# Patient Record
Sex: Female | Born: 1945 | Race: White | Hispanic: No | Marital: Married | Smoking: Former smoker
Health system: Southern US, Community
[De-identification: ages and names within clinical notes are randomized; demographics above are authoritative.]

## PROBLEM LIST (undated history)

## (undated) DIAGNOSIS — T8859XA Other complications of anesthesia, initial encounter: Secondary | ICD-10-CM

## (undated) DIAGNOSIS — H409 Unspecified glaucoma: Secondary | ICD-10-CM

## (undated) DIAGNOSIS — T4145XA Adverse effect of unspecified anesthetic, initial encounter: Secondary | ICD-10-CM

## (undated) DIAGNOSIS — M810 Age-related osteoporosis without current pathological fracture: Secondary | ICD-10-CM

## (undated) DIAGNOSIS — E039 Hypothyroidism, unspecified: Secondary | ICD-10-CM

## (undated) DIAGNOSIS — I10 Essential (primary) hypertension: Secondary | ICD-10-CM

## (undated) DIAGNOSIS — K5790 Diverticulosis of intestine, part unspecified, without perforation or abscess without bleeding: Secondary | ICD-10-CM

## (undated) DIAGNOSIS — E7800 Pure hypercholesterolemia, unspecified: Secondary | ICD-10-CM

## (undated) HISTORY — DX: Essential (primary) hypertension: I10

## (undated) HISTORY — PX: DILATION AND CURETTAGE OF UTERUS: SHX78

## (undated) HISTORY — DX: Hypothyroidism, unspecified: E03.9

## (undated) HISTORY — PX: KNEE SURGERY: SHX244

## (undated) HISTORY — PX: HAND SURGERY: SHX662

## (undated) HISTORY — DX: Pure hypercholesterolemia, unspecified: E78.00

## (undated) HISTORY — DX: Age-related osteoporosis without current pathological fracture: M81.0

---

## 1998-04-02 ENCOUNTER — Encounter: Payer: Self-pay | Admitting: Internal Medicine

## 1998-04-02 ENCOUNTER — Ambulatory Visit (HOSPITAL_COMMUNITY): Admission: RE | Admit: 1998-04-02 | Discharge: 1998-04-02 | Payer: Self-pay | Admitting: Internal Medicine

## 1998-04-17 ENCOUNTER — Encounter: Admission: RE | Admit: 1998-04-17 | Discharge: 1998-05-09 | Payer: Self-pay | Admitting: Internal Medicine

## 1999-01-23 ENCOUNTER — Other Ambulatory Visit: Admission: RE | Admit: 1999-01-23 | Discharge: 1999-01-23 | Payer: Self-pay | Admitting: Obstetrics and Gynecology

## 2000-01-24 ENCOUNTER — Other Ambulatory Visit: Admission: RE | Admit: 2000-01-24 | Discharge: 2000-01-24 | Payer: Self-pay | Admitting: Obstetrics and Gynecology

## 2001-04-23 ENCOUNTER — Ambulatory Visit (HOSPITAL_COMMUNITY): Admission: RE | Admit: 2001-04-23 | Discharge: 2001-04-23 | Payer: Self-pay | Admitting: Internal Medicine

## 2001-06-07 ENCOUNTER — Encounter: Payer: Self-pay | Admitting: Internal Medicine

## 2001-06-07 ENCOUNTER — Encounter: Admission: RE | Admit: 2001-06-07 | Discharge: 2001-06-07 | Payer: Self-pay | Admitting: Internal Medicine

## 2001-07-30 ENCOUNTER — Encounter: Payer: Self-pay | Admitting: Internal Medicine

## 2001-07-30 ENCOUNTER — Ambulatory Visit (HOSPITAL_COMMUNITY): Admission: RE | Admit: 2001-07-30 | Discharge: 2001-07-30 | Payer: Self-pay | Admitting: Internal Medicine

## 2002-01-27 ENCOUNTER — Other Ambulatory Visit: Admission: RE | Admit: 2002-01-27 | Discharge: 2002-01-27 | Payer: Self-pay | Admitting: Obstetrics and Gynecology

## 2002-06-15 ENCOUNTER — Encounter: Payer: Self-pay | Admitting: Internal Medicine

## 2002-06-15 ENCOUNTER — Encounter: Admission: RE | Admit: 2002-06-15 | Discharge: 2002-06-15 | Payer: Self-pay | Admitting: Internal Medicine

## 2002-07-04 ENCOUNTER — Encounter: Admission: RE | Admit: 2002-07-04 | Discharge: 2002-07-04 | Payer: Self-pay | Admitting: Internal Medicine

## 2002-07-04 ENCOUNTER — Encounter: Payer: Self-pay | Admitting: Internal Medicine

## 2003-01-30 ENCOUNTER — Other Ambulatory Visit: Admission: RE | Admit: 2003-01-30 | Discharge: 2003-01-30 | Payer: Self-pay | Admitting: Obstetrics and Gynecology

## 2003-06-07 ENCOUNTER — Ambulatory Visit (HOSPITAL_BASED_OUTPATIENT_CLINIC_OR_DEPARTMENT_OTHER): Admission: RE | Admit: 2003-06-07 | Discharge: 2003-06-07 | Payer: Self-pay | Admitting: Otolaryngology

## 2003-06-07 ENCOUNTER — Encounter (INDEPENDENT_AMBULATORY_CARE_PROVIDER_SITE_OTHER): Payer: Self-pay | Admitting: *Deleted

## 2003-06-07 ENCOUNTER — Ambulatory Visit (HOSPITAL_COMMUNITY): Admission: RE | Admit: 2003-06-07 | Discharge: 2003-06-07 | Payer: Self-pay | Admitting: Otolaryngology

## 2004-02-07 ENCOUNTER — Other Ambulatory Visit: Admission: RE | Admit: 2004-02-07 | Discharge: 2004-02-07 | Payer: Self-pay | Admitting: Obstetrics and Gynecology

## 2005-02-07 ENCOUNTER — Other Ambulatory Visit: Admission: RE | Admit: 2005-02-07 | Discharge: 2005-02-07 | Payer: Self-pay | Admitting: Obstetrics and Gynecology

## 2005-03-03 ENCOUNTER — Encounter: Admission: RE | Admit: 2005-03-03 | Discharge: 2005-03-03 | Payer: Self-pay | Admitting: Obstetrics and Gynecology

## 2006-02-12 ENCOUNTER — Other Ambulatory Visit: Admission: RE | Admit: 2006-02-12 | Discharge: 2006-02-12 | Payer: Self-pay | Admitting: Obstetrics and Gynecology

## 2006-03-04 ENCOUNTER — Encounter: Admission: RE | Admit: 2006-03-04 | Discharge: 2006-03-04 | Payer: Self-pay | Admitting: Internal Medicine

## 2006-05-28 ENCOUNTER — Ambulatory Visit (HOSPITAL_COMMUNITY): Admission: RE | Admit: 2006-05-28 | Discharge: 2006-05-28 | Payer: Self-pay | Admitting: Internal Medicine

## 2007-02-15 ENCOUNTER — Other Ambulatory Visit: Admission: RE | Admit: 2007-02-15 | Discharge: 2007-02-15 | Payer: Self-pay | Admitting: Obstetrics and Gynecology

## 2007-03-15 ENCOUNTER — Encounter: Admission: RE | Admit: 2007-03-15 | Discharge: 2007-03-15 | Payer: Self-pay | Admitting: Obstetrics and Gynecology

## 2008-02-29 ENCOUNTER — Ambulatory Visit: Payer: Self-pay | Admitting: Obstetrics and Gynecology

## 2008-02-29 ENCOUNTER — Other Ambulatory Visit: Admission: RE | Admit: 2008-02-29 | Discharge: 2008-02-29 | Payer: Self-pay | Admitting: Obstetrics and Gynecology

## 2008-02-29 ENCOUNTER — Encounter: Payer: Self-pay | Admitting: Obstetrics and Gynecology

## 2008-10-10 ENCOUNTER — Ambulatory Visit: Payer: Self-pay | Admitting: Obstetrics and Gynecology

## 2009-03-02 ENCOUNTER — Other Ambulatory Visit: Admission: RE | Admit: 2009-03-02 | Discharge: 2009-03-02 | Payer: Self-pay | Admitting: Obstetrics and Gynecology

## 2009-03-02 ENCOUNTER — Ambulatory Visit: Payer: Self-pay | Admitting: Obstetrics and Gynecology

## 2009-03-15 ENCOUNTER — Ambulatory Visit: Payer: Self-pay | Admitting: Obstetrics and Gynecology

## 2009-04-27 ENCOUNTER — Ambulatory Visit: Payer: Self-pay | Admitting: Obstetrics and Gynecology

## 2009-05-22 ENCOUNTER — Encounter: Admission: RE | Admit: 2009-05-22 | Discharge: 2009-05-22 | Payer: Self-pay | Admitting: Obstetrics and Gynecology

## 2009-10-17 ENCOUNTER — Ambulatory Visit: Payer: Self-pay | Admitting: Obstetrics and Gynecology

## 2010-03-04 ENCOUNTER — Other Ambulatory Visit
Admission: RE | Admit: 2010-03-04 | Discharge: 2010-03-04 | Payer: Self-pay | Source: Home / Self Care | Admitting: Obstetrics and Gynecology

## 2010-03-04 ENCOUNTER — Ambulatory Visit
Admission: RE | Admit: 2010-03-04 | Discharge: 2010-03-04 | Payer: Self-pay | Source: Home / Self Care | Attending: Obstetrics and Gynecology | Admitting: Obstetrics and Gynecology

## 2010-03-04 ENCOUNTER — Other Ambulatory Visit: Payer: Self-pay | Admitting: Obstetrics and Gynecology

## 2010-03-18 DIAGNOSIS — Z1211 Encounter for screening for malignant neoplasm of colon: Secondary | ICD-10-CM

## 2010-04-17 ENCOUNTER — Ambulatory Visit: Payer: Self-pay | Admitting: Obstetrics and Gynecology

## 2010-04-17 ENCOUNTER — Other Ambulatory Visit: Payer: Self-pay

## 2010-04-18 ENCOUNTER — Other Ambulatory Visit: Payer: 59

## 2010-04-18 ENCOUNTER — Ambulatory Visit (INDEPENDENT_AMBULATORY_CARE_PROVIDER_SITE_OTHER): Payer: 59 | Admitting: Obstetrics and Gynecology

## 2010-04-18 DIAGNOSIS — N83209 Unspecified ovarian cyst, unspecified side: Secondary | ICD-10-CM

## 2010-06-28 NOTE — H&P (Signed)
NAME:  April Bowers, April Bowers                            ACCOUNT NO.:  [id number]   MEDICAL RECORD NO.:  [id number]                   PATIENT TYPE:  AMB   LOCATION:  DSC                                  FACILITY:  [facility]   PHYSICIAN:  Hermelinda Medicus, M.D.                DATE OF BIRTH:  12/24/1945   DATE OF ADMISSION:  06/07/2003  DATE OF DISCHARGE:                                HISTORY & PHYSICAL   HISTORY OF PRESENT ILLNESS:  This patient is a 65 year old female who has  had a bluish-black lesion in the superior aspect of her left ear canal.  This has not changed excessively, although it does appear to be slightly  increased in size on observation, and she has had no pain here, and has had  no history of ear infection.  The tympanic membrane looks in excellent  condition and at this point, we are planning to do excisional biopsy of this  lesion in the ear canal.  The patient is aware of the concerns and we will  be doing this under local anesthesia to get a diagnosis as soon as possible.   PAST MEDICAL HISTORY:  Her past history is remarkable in the fact that she  is allergic to PENICILLIN and SULFA.  Penicillin causing a rash; sulfa  causing apparently some swelling.   MEDICATIONS:  Synthroid, Allegra, multiple vitamin, 81 mg of aspirin,  calcium and Vitamin E and she uses an albuterol inhaler.   SOCIAL HISTORY:  She does not smoke or drink.   REVIEW OF SYSTEMS:  Knee surgery apparently in the distal past, but she has  no cardiac or neurologic problems.  She does have some mild asthma.  GI, GU,  hematology are all within normal limits.  She wears contacts.   PHYSICAL EXAMINATION:  GENERAL:  Well-nourished, well-developed female in no  acute distress.  VITAL SIGNS:  Blood pressure 126/79, heart rate 61.  HEENT:  Ears-in the superior anterior aspect of her external ear canal, a  lesion that is bluish-black, that may be vascular, may be a melanotic-type  lesion, for excision.  Tympanic  membranes within normal limits.  The right  ear is clear.  The nose is clear.  The oral cavity is clear.  No salivary  abnormalities.  Lips, teeth and gums are within normal limits.  NECK:  Free of any thyromegaly, cervical adenopathy or mass.  CHEST:  Clear, no rales, rhonchi or wheezes.  CARDIOVASCULAR:  No murmurs or gallops.  ABDOMEN:  Free of any organomegaly, tenderness or masses.   INITIAL DIAGNOSES:  1. Left ear, superior ear canal lesion.  2. History of asthma.   PLAN:  Plan is for excisional biopsy.  Hermelinda Medicus, M.D.    JC/MEDQ  D:  06/07/2003  T:  06/07/2003  Job:  [id number]   cc:   Lilla Shook, M.D.  [address]  [place]  [place] [address]  Fax: [phone]   Kerry Kass, M.D. [facility]

## 2010-06-28 NOTE — Op Note (Signed)
NAME:  April Bowers, April Bowers                            ACCOUNT NO.:  [id number]   MEDICAL RECORD NO.:  [id number]                   PATIENT TYPE:  AMB   LOCATION:  DSC                                  FACILITY:  [facility]   PHYSICIAN:  Hermelinda Medicus, M.D.                DATE OF BIRTH:  26-Nov-1945   DATE OF PROCEDURE:  06/07/2003  DATE OF DISCHARGE:                                 OPERATIVE REPORT   PREOPERATIVE DIAGNOSIS:  Lesion of the superior/posterior ear canal left,  rule out hemangioma, rule out melanoma, rule out lymphangioma.   POSTOPERATIVE DIAGNOSIS:  Lesion of the superior/posterior ear canal left,  rule out hemangioma, rule out melanoma, rule out lymphangioma.   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   SURGEON:  Hermelinda Medicus, M.D.   ANESTHESIA:  Local 1% Xylocaine with epinephrine with Janetta Hora. Gelene Mink,  M.D.   DESCRIPTION OF PROCEDURE:  The patient was placed in the supine position and  under local anesthesia was prepped and draped and then Betadine was used in  the external ear canal as a prep.  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  This area was then excised in an  elliptical fashion not getting down to the canal and to the annulus or the  tympanic membrane.  The skin and the lesion were totally excised in two  pieces and an open area of bare bone was cauterized using silver nitrate.  No grafting was done. It was felt this would heal in very effectively.  As  we removed this it appeared to be more of a hemangioma problem which is that  of then waylaying away from the concept of melanoma.  Once this was removed,  all hemostasis was  established.  Cortisporin and Ciprodex impregnated  Gelfoam was then placed in the external ear canal followed by Neosporin  ointment followed by the cotton in the external ear canal.  The patient  tolerated the procedure well and is doing well postoperatively.   Follow-up will be in one week, three weeks, six weeks, six months, and a  year.                                               Hermelinda Medicus, M.D.    JC/MEDQ  D:  06/07/2003  T:  06/07/2003  Job:  [id number]   cc:   Kerry Kass, M.D. [facility] [place],  M.D.  [address] [place]  [place] [address]  Fax: [phone]

## 2011-02-28 DIAGNOSIS — M8580 Other specified disorders of bone density and structure, unspecified site: Secondary | ICD-10-CM | POA: Insufficient documentation

## 2011-02-28 DIAGNOSIS — E039 Hypothyroidism, unspecified: Secondary | ICD-10-CM | POA: Insufficient documentation

## 2011-02-28 DIAGNOSIS — J45909 Unspecified asthma, uncomplicated: Secondary | ICD-10-CM | POA: Insufficient documentation

## 2011-02-28 DIAGNOSIS — N83209 Unspecified ovarian cyst, unspecified side: Secondary | ICD-10-CM | POA: Insufficient documentation

## 2011-03-12 ENCOUNTER — Encounter: Payer: Self-pay | Admitting: Obstetrics and Gynecology

## 2011-03-26 ENCOUNTER — Ambulatory Visit (INDEPENDENT_AMBULATORY_CARE_PROVIDER_SITE_OTHER): Payer: Medicare Other | Admitting: Obstetrics and Gynecology

## 2011-03-26 ENCOUNTER — Encounter: Payer: Self-pay | Admitting: Obstetrics and Gynecology

## 2011-03-26 VITALS — BP 124/80 | Ht 62.5 in | Wt 158.0 lb

## 2011-03-26 DIAGNOSIS — N83209 Unspecified ovarian cyst, unspecified side: Secondary | ICD-10-CM

## 2011-03-26 NOTE — Progress Notes (Signed)
Patient came to see me today for further followup. She has significant vaginal dryness which causes dyspareunia. She is having no vaginal bleeding. We have offered her vaginal estrogen in the past and she has declined. They're using lubricant. We'll also watching her with a left ovarian cyst. She is having no pelvic pain. She is due for followup ultrasound in March. She also has stable osteopenia. She takes calcium and vitamin D. She's had no fractures. She had her last bone density in 2011. She is having no menopausal symptoms. She is up-to-date on mammograms. She does her lab work from her PCP. He is now treating her for hypothyroidism after radioactive iodine.  ROS: 12 system review done. Pertinent positives above. Only other positive is asthma.  Physical examination: Kennon Portela present HEENT within normal limits. Neck: Thyroid not large. No masses. Supraclavicular nodes: not enlarged. Breasts: Examined in both sitting midline position. No skin changes and no masses. Abdomen: Soft no guarding rebound or masses or hernia. Pelvic: External: Within normal limits. BUS: Within normal limits. Vaginal:within normal limits. poor estrogen effect. No evidence of cystocele rectocele or enterocele. Cervix: clean. Uterus: Normal size and shape. Adnexa: No masses. Rectovaginal exam: Confirmatory and negative. Extremities: Within normal limits.  Assessment: #1. Left ovarian cyst #2. Osteopenia #3. Atrophic vaginitis  Plan: Scheduled pelvic ultrasound. We will skip bone density this year. Continue calcium and vitamin D. Discussed vaginal estrogen. Patient declined. Please note that we previously use Vagifem before exams for comfort. After several weeks patient would get a headache.

## 2011-03-26 NOTE — Patient Instructions (Signed)
Schedule pelvic ultrasound

## 2011-04-21 ENCOUNTER — Ambulatory Visit (INDEPENDENT_AMBULATORY_CARE_PROVIDER_SITE_OTHER): Payer: Medicare Other

## 2011-04-21 ENCOUNTER — Ambulatory Visit (INDEPENDENT_AMBULATORY_CARE_PROVIDER_SITE_OTHER): Payer: Medicare Other | Admitting: Obstetrics and Gynecology

## 2011-04-21 DIAGNOSIS — D219 Benign neoplasm of connective and other soft tissue, unspecified: Secondary | ICD-10-CM

## 2011-04-21 DIAGNOSIS — N83209 Unspecified ovarian cyst, unspecified side: Secondary | ICD-10-CM

## 2011-04-21 DIAGNOSIS — N949 Unspecified condition associated with female genital organs and menstrual cycle: Secondary | ICD-10-CM

## 2011-04-21 DIAGNOSIS — D259 Leiomyoma of uterus, unspecified: Secondary | ICD-10-CM

## 2011-04-21 DIAGNOSIS — R102 Pelvic and perineal pain: Secondary | ICD-10-CM

## 2011-04-21 DIAGNOSIS — R1032 Left lower quadrant pain: Secondary | ICD-10-CM

## 2011-04-21 NOTE — Progress Notes (Signed)
Patient came back to see me today for followup of a left ovarian cyst. On ultrasound she has a normal uterus with a stable myoma. Her endometrial echo is 1.1 mm. Her right ovary is completely normal. Her left ovary continues to show a thin walled echo free avascular cyst of 2.4 cm which is also stable from last ultrasound. The patient is asymptomatic  Assessment: Stable left ovarian cyst. Stable fibroid uterus.  Plan: Patient reassured. Return in one-year for annual exam.

## 2012-03-18 ENCOUNTER — Encounter: Payer: Self-pay | Admitting: Women's Health

## 2012-03-26 ENCOUNTER — Encounter: Payer: Medicare Other | Admitting: Women's Health

## 2012-04-05 ENCOUNTER — Encounter: Payer: Self-pay | Admitting: Women's Health

## 2012-04-05 ENCOUNTER — Ambulatory Visit (INDEPENDENT_AMBULATORY_CARE_PROVIDER_SITE_OTHER): Payer: Medicare Other | Admitting: Women's Health

## 2012-04-05 ENCOUNTER — Other Ambulatory Visit: Payer: Self-pay | Admitting: Women's Health

## 2012-04-05 VITALS — BP 118/70 | Ht 62.5 in | Wt 161.0 lb

## 2012-04-05 DIAGNOSIS — M899 Disorder of bone, unspecified: Secondary | ICD-10-CM

## 2012-04-05 DIAGNOSIS — M949 Disorder of cartilage, unspecified: Secondary | ICD-10-CM

## 2012-04-05 DIAGNOSIS — M8580 Other specified disorders of bone density and structure, unspecified site: Secondary | ICD-10-CM

## 2012-04-05 DIAGNOSIS — N83209 Unspecified ovarian cyst, unspecified side: Secondary | ICD-10-CM

## 2012-04-05 NOTE — Patient Instructions (Addendum)

## 2012-04-05 NOTE — Progress Notes (Signed)
April Bowers 07/17/1945 [id number]    History:    The patient presents for Breast and pelvic exam.  Postmenopausal with no bleeding. History of persistent 24 mm left ovarian thin-walled ovarian cyst. Labs done through primary care. Bone density osteopenia stable  2011, did not tolerate Fosamax or Boniva. Had been on Actonel for several years but then had aching joints. History of normal Paps and mammograms. Colonoscopy 2005. Hypothyroid on Synthroid. Has had zostavac and Pneumovax.   Past medical history, past surgical history, family history and social history were all reviewed and documented in the EPIC chart. Daughter Darl Pikes died age 61 from accident/head injury last year.   Exam:  Filed Vitals:   04/05/12 1035  BP: 118/70    General appearance:  Normal Head/Neck:  Normal, without cervical or supraclavicular adenopathy. Thyroid:  Symmetrical, normal in size, without palpable masses or nodularity. Respiratory  Effort:  Normal  Auscultation:  Clear without wheezing or rhonchi Cardiovascular  Auscultation:  Regular rate, without rubs, murmurs or gallops  Edema/varicosities:  Not grossly evident Abdominal  Soft,nontender, without masses, guarding or rebound.  Liver/spleen:  No organomegaly noted  Hernia:  None appreciated  Skin  Inspection:  Grossly normal  Palpation:  Grossly normal Neurologic/psychiatric  Orientation:  Normal with appropriate conversation.  Mood/affect:  Normal  Genitourinary    Breasts: Examined lying and sitting.     Right: Without masses, retractions, discharge or axillary adenopathy.     Left: Without masses, retractions, discharge or axillary adenopathy.   Inguinal/mons:  Normal without inguinal adenopathy  External genitalia:  Normal  BUS/Urethra/Skene's glands:  Normal  Bladder:  Normal  Vagina:  atrophic  Cervix:  Normal  Uterus:   normal in size, shape and contour.  Midline and mobile  Adnexa/parametria:     Rt: Without masses or  tenderness.   Lt: Without masses or tenderness.  Anus and perineum: Normal  Digital rectal exam: Normal sphincter tone without palpated masses or tenderness  Assessment/Plan:  67 y.o. MWF G3P1 for breast and pelvic exam.    Persistent left ovarian 24 mm thin walled echo-free cyst Osteopenia stable, T score AP spine -0.5, left femoral neck -1.5 FRAX 6.1%/0.5% Hypothyroid-primary care labs and meds  Plan: Pelvic ultrasound, will schedule to check stability. SBE's, continue annual mammogram, calcium rich diet, vitamin D 2000 daily encouraged. Home safety and fall prevention discussed. Repeat DEXA this year I. Grams per imaging where previous scans have been done. Normal Pap 2012, new screening guidelines reviewed.     Harrington Challenger [facility], 12:50 PM 04/05/2012

## 2012-04-19 ENCOUNTER — Ambulatory Visit (INDEPENDENT_AMBULATORY_CARE_PROVIDER_SITE_OTHER): Payer: Medicare Other

## 2012-04-19 ENCOUNTER — Ambulatory Visit (INDEPENDENT_AMBULATORY_CARE_PROVIDER_SITE_OTHER): Payer: Medicare Other | Admitting: Women's Health

## 2012-04-19 ENCOUNTER — Encounter: Payer: Self-pay | Admitting: Women's Health

## 2012-04-19 DIAGNOSIS — N83202 Unspecified ovarian cyst, left side: Secondary | ICD-10-CM

## 2012-04-19 DIAGNOSIS — D259 Leiomyoma of uterus, unspecified: Secondary | ICD-10-CM

## 2012-04-19 DIAGNOSIS — N83209 Unspecified ovarian cyst, unspecified side: Secondary | ICD-10-CM

## 2012-04-19 DIAGNOSIS — D251 Intramural leiomyoma of uterus: Secondary | ICD-10-CM

## 2012-04-19 NOTE — Progress Notes (Signed)
Patient ID: April Bowers, female   DOB: Nov 07, 1945, 67 y.o.   MRN: [id number] Presents for ultrasound. Has had a persistent non-changing left ovarian cyst for greater than 10 years. Left ovarian cyst 2013 avascular  24 mm mean. Postmenopausal with no bleeding. Without complaint today.  Ultrasound: Anteverted uterus with single fibroid 6 x 8 mm. Fluid seen in endometrial cavity. Endometrium 1.4 mm. Right ovary atrophic. Left ovary appears normal, adjacent to left ovary is a thin-walled echo-free cyst 28 x 27 x 21 mm 25 mm mean. Avascular.  Persistent left ovarian thin-walled avascular cyst   Plan: Instructed to report abdominal bloating, changes in bowel elimination, abdominal pain. Annual exam with pelvic and ultrasound in one year or as needed.

## 2012-04-21 ENCOUNTER — Ambulatory Visit
Admission: RE | Admit: 2012-04-21 | Discharge: 2012-04-21 | Disposition: A | Discharge status: Home / Self Care | Payer: Medicare Other | Source: Ambulatory Visit | Attending: Women's Health | Admitting: Women's Health

## 2012-04-21 DIAGNOSIS — M8580 Other specified disorders of bone density and structure, unspecified site: Secondary | ICD-10-CM

## 2012-04-28 ENCOUNTER — Telehealth: Payer: Self-pay | Admitting: Women's Health

## 2012-04-28 NOTE — Telephone Encounter (Signed)
Telephone call to review bone density. Reviewed T score -1.1 of the spine, T score -1.7 at hip/ stable. FRAX 16%/2.2%. Does have a history of a traumatic fall causing wrist fracture  about 7 years ago. Home safety, fall prevention, calcium rich diet, vitamin D encouraged. Continue regular exercise and healthy lifestyle.

## 2012-11-07 ENCOUNTER — Encounter: Payer: Self-pay | Admitting: Family Medicine

## 2012-11-07 ENCOUNTER — Ambulatory Visit (INDEPENDENT_AMBULATORY_CARE_PROVIDER_SITE_OTHER): Payer: 59 | Admitting: Family Medicine

## 2012-11-07 DIAGNOSIS — M25549 Pain in joints of unspecified hand: Secondary | ICD-10-CM

## 2012-11-07 DIAGNOSIS — T6391XA Toxic effect of contact with unspecified venomous animal, accidental (unintentional), initial encounter: Secondary | ICD-10-CM

## 2012-11-07 DIAGNOSIS — M79641 Pain in right hand: Secondary | ICD-10-CM

## 2012-11-07 MED ORDER — METHYLPREDNISOLONE ACETATE 80 MG/ML IJ SUSP
80.0000 mg | Freq: Once | INTRAMUSCULAR | Status: AC
  Administered 2012-11-07: 80 mg via INTRAMUSCULAR

## 2012-11-07 NOTE — Patient Instructions (Addendum)
Take an antihistamine, Benadryl or Zyrtec, for itching and allergic reaction  Ice to hand every several hours when awake  Keep hand elevated to reduce swelling, and keep her rings off until much better  Return if worse or not improving over next few days

## 2012-11-07 NOTE — Progress Notes (Signed)
Subjective: 67 year old lady who was at the [employer] football game yesterday and got stung on the right fifth finger PIP joint by some kind of a bee. The first day people gave her some topical and told her to ice it and elevate it. She took her rings off. It has continued to swell in the finger and into the hand and adjacent finger. It is warm to touch and painful.  Objective: Pleasant lady with a swollen fifth finger, almost forming some blisters on it. There is mild erythema. There is warmth to touch. It is tender.  Assessment: Venomous sting right fifth finger  Plan: Depo-Medrol 80 IM Zyrtec or Benadryl Return if worse

## 2013-03-21 ENCOUNTER — Encounter: Payer: Self-pay | Admitting: Women's Health

## 2013-04-06 ENCOUNTER — Encounter: Payer: Medicare Other | Admitting: Women's Health

## 2013-04-07 ENCOUNTER — Encounter: Payer: Self-pay | Admitting: Women's Health

## 2013-04-15 ENCOUNTER — Encounter: Payer: Self-pay | Admitting: Women's Health

## 2013-04-15 ENCOUNTER — Ambulatory Visit (INDEPENDENT_AMBULATORY_CARE_PROVIDER_SITE_OTHER): Payer: 59 | Admitting: Women's Health

## 2013-04-15 VITALS — BP 126/84 | Ht 62.25 in | Wt 157.0 lb

## 2013-04-15 DIAGNOSIS — I10 Essential (primary) hypertension: Secondary | ICD-10-CM

## 2013-04-15 DIAGNOSIS — N83209 Unspecified ovarian cyst, unspecified side: Secondary | ICD-10-CM

## 2013-04-15 NOTE — Progress Notes (Signed)
April Bowers October 22, 1970 [id number]    History:    Presents for breast and pelvic exam. Postmenopausal/no bleeding/no HRT. Normal Pap and mammogram history. Negative colonoscopy 2005 scheduled in June. If History of a left ovarian cyst approximately 24 mm for greater than 18 years. Recently having some low left discomfort occasionally. Osteopenic unable to tolerate Boniva, Actonel, Fosamax. 03/2012 DEXA T score -1.7 left femoral neck  FRAX 16%/2.2%. Hyperthyroid primary care manages.  Past medical history, past surgical history, family history and social history were all reviewed and documented in the EPIC chart. Retired. Daughter April Bowers died from a head injury for years ago. Current on immunizations.   Exam:  Filed Vitals:   04/15/13 1445  BP: 126/84    General appearance:  Normal Thyroid:  Symmetrical, normal in size, without palpable masses or nodularity. Respiratory  Auscultation:  Clear without wheezing or rhonchi Cardiovascular  Auscultation:  Regular rate, without rubs, murmurs or gallops  Edema/varicosities:  Not grossly evident Abdominal  Soft,nontender, without masses, guarding or rebound.  Liver/spleen:  No organomegaly noted  Hernia:  None appreciated  Skin  Inspection:  Grossly normal   Breasts: Examined lying and sitting.     Right: Without masses, retractions, discharge or axillary adenopathy.     Left: Without masses, retractions, discharge or axillary adenopathy. Gentitourinary   Inguinal/mons:  Normal without inguinal adenopathy  External genitalia:  Normal  BUS/Urethra/Skene's glands:  Normal  Vagina:  Normal  Cervix:  Normal  Uterus:   normal in size, shape and contour.  Midline and mobile  Adnexa/parametria:     Rt: Without masses or tenderness.   Lt: Without masses or tenderness.  Anus and perineum: Normal  Digital rectal exam: Normal sphincter tone without palpated masses or tenderness  Assessment/Plan:  68 y.o. MWF G3P1 for breast and pelvic  exam.  Persistent left ovarian cyst Osteopenia stable DEXA Hypertension/hypothyroid primary care manages labs and meds  Plan: Ultrasound, will schedule to check stability. Continue active lifestyle and regular exercise, calcium rich diet, vitamin D 2000 daily. Home safety and fall prevention discussed. SBE's, continue annual mammogram. History of normal Paps, new screening guidelines reviewed. Encouraged vaginal lubricants with intercourse for atrophic vaginitis. Keep schedule colonoscopy appointment.    April Bowers [facility], 3:25 PM 04/15/2013

## 2013-04-15 NOTE — Patient Instructions (Signed)
Health Recommendations for Postmenopausal Women Respected and ongoing research has looked at the most common causes of death, disability, and poor quality of life in postmenopausal women. The causes include heart disease, diseases of blood vessels, diabetes, depression, cancer, and bone loss (osteoporosis). Many things can be done to help lower the chances of developing these and other common problems: CARDIOVASCULAR DISEASE Heart Disease: A heart attack is a medical emergency. Know the signs and symptoms of a heart attack. Below are things women can do to reduce their risk for heart disease.   Do not smoke. If you smoke, quit.  Aim for a healthy weight. Being overweight causes many preventable deaths. Eat a healthy and balanced diet and drink an adequate amount of liquids.  Get moving. Make a commitment to be more physically active. Aim for 30 minutes of activity on most, if not all days of the week.  Eat for heart health. Choose a diet that is low in saturated fat and cholesterol and eliminate trans fat. Include whole grains, vegetables, and fruits. Read and understand the labels on food containers before buying.  Know your numbers. Ask your caregiver to check your blood pressure, cholesterol (total, HDL, LDL, triglycerides) and blood glucose. Work with your caregiver on improving your entire clinical picture.  High blood pressure. Limit or stop your table salt intake (try salt substitute and food seasonings). Avoid salty foods and drinks. Read labels on food containers before buying. Eating well and exercising can help control high blood pressure. STROKE  Stroke is a medical emergency. Stroke may be the result of a blood clot in a blood vessel in the brain or by a brain hemorrhage (bleeding). Know the signs and symptoms of a stroke. To lower the risk of developing a stroke:  Avoid fatty foods.  Quit smoking.  Control your diabetes, blood pressure, and irregular heart rate. THROMBOPHLEBITIS  (BLOOD CLOT) OF THE LEG  Becoming overweight and leading a stationary lifestyle may also contribute to developing blood clots. Controlling your diet and exercising will help lower the risk of developing blood clots. CANCER SCREENING  Breast Cancer: Take steps to reduce your risk of breast cancer.  You should practice "breast self-awareness." This means understanding the normal appearance and feel of your breasts and should include breast self-examination. Any changes detected, no matter how small, should be reported to your caregiver.  After age 40, you should have a clinical breast exam (CBE) every year.  Starting at age 40, you should consider having a mammogram (breast X-ray) every year.  If you have a family history of breast cancer, talk to your caregiver about genetic screening.  If you are at high risk for breast cancer, talk to your caregiver about having an MRI and a mammogram every year.  Intestinal or Stomach Cancer: Tests to consider are a rectal exam, fecal occult blood, sigmoidoscopy, and colonoscopy. Women who are high risk may need to be screened at an earlier age and more often.  Cervical Cancer:  Beginning at age 30, you should have a Pap test every 3 years as long as the past 3 Pap tests have been normal.  If you have had past treatment for cervical cancer or a condition that could lead to cancer, you need Pap tests and screening for cancer for at least 20 years after your treatment.  If you had a hysterectomy for a problem that was not cancer or a condition that could lead to cancer, then you no longer need Pap tests.    If you are between ages 65 and 70, and you have had normal Pap tests going back 10 years, you no longer need Pap tests.  If Pap tests have been discontinued, risk factors (such as a new sexual partner) need to be reassessed to determine if screening should be resumed.  Some medical problems can increase the chance of getting cervical cancer. In these  cases, your caregiver may recommend more frequent screening and Pap tests.  Uterine Cancer: If you have vaginal bleeding after reaching menopause, you should notify your caregiver.  Ovarian cancer: Other than yearly pelvic exams, there are no reliable tests available to screen for ovarian cancer at this time except for yearly pelvic exams.  Lung Cancer: Yearly chest X-rays can detect lung cancer and should be done on high risk women, such as cigarette smokers and women with chronic lung disease (emphysema).  Skin Cancer: A complete body skin exam should be done at your yearly examination. Avoid overexposure to the sun and ultraviolet light lamps. Use a strong sun block cream when in the sun. All of these things are important in lowering the risk of skin cancer. MENOPAUSE Menopause Symptoms: Hormone therapy products are effective for treating symptoms associated with menopause:  Moderate to severe hot flashes.  Night sweats.  Mood swings.  Headaches.  Tiredness.  Loss of sex drive.  Insomnia.  Other symptoms. Hormone replacement carries certain risks, especially in older women. Women who use or are thinking about using estrogen or estrogen with progestin treatments should discuss that with their caregiver. Your caregiver will help you understand the benefits and risks. The ideal dose of hormone replacement therapy is not known. The Food and Drug Administration (FDA) has concluded that hormone therapy should be used only at the lowest doses and for the shortest amount of time to reach treatment goals.  OSTEOPOROSIS Protecting Against Bone Loss and Preventing Fracture: If you use hormone therapy for prevention of bone loss (osteoporosis), the risks for bone loss must outweigh the risk of the therapy. Ask your caregiver about other medications known to be safe and effective for preventing bone loss and fractures. To guard against bone loss or fractures, the following is recommended:  If  you are less than age 50, take 1000 mg of calcium and at least 600 mg of Vitamin D per day.  If you are greater than age 50 but less than age 70, take 1200 mg of calcium and at least 600 mg of Vitamin D per day.  If you are greater than age 70, take 1200 mg of calcium and at least 800 mg of Vitamin D per day. Smoking and excessive alcohol intake increases the risk of osteoporosis. Eat foods rich in calcium and vitamin D and do weight bearing exercises several times a week as your caregiver suggests. DIABETES Diabetes Melitus: If you have Type I or Type 2 diabetes, you should keep your blood sugar under control with diet, exercise and recommended medication. Avoid too many sweets, starchy and fatty foods. Being overweight can make control more difficult. COGNITION AND MEMORY Cognition and Memory: Menopausal hormone therapy is not recommended for the prevention of cognitive disorders such as Alzheimer's disease or memory loss.  DEPRESSION  Depression may occur at any age, but is common in elderly women. The reasons may be because of physical, medical, social (loneliness), or financial problems and needs. If you are experiencing depression because of medical problems and control of symptoms, talk to your caregiver about this. Physical activity and   exercise may help with mood and sleep. Community and volunteer involvement may help your sense of value and worth. If you have depression and you feel that the problem is getting worse or becoming severe, talk to your caregiver about treatment options that are best for you. ACCIDENTS  Accidents are common and can be serious in the elderly woman. Prepare your house to prevent accidents. Eliminate throw rugs, place hand bars in the bath, shower and toilet areas. Avoid wearing high heeled shoes or walking on wet, snowy, and icy areas. Limit or stop driving if you have vision or hearing problems, or you feel you are unsteady with you movements and  reflexes. HEPATITIS C Hepatitis C is a type of viral infection affecting the liver. It is spread mainly through contact with blood from an infected person. It can be treated, but if left untreated, it can lead to severe liver damage over years. Many people who are infected do not know that the virus is in their blood. If you are a "baby-boomer", it is recommended that you have one screening test for Hepatitis C. IMMUNIZATIONS  Several immunizations are important to consider having during your senior years, including:   Tetanus, diptheria, and pertussis booster shot.  Influenza every year before the flu season begins.  Pneumonia vaccine.  Shingles vaccine.  Others as indicated based on your specific needs. Talk to your caregiver about these. Document Released: 03/21/2005 Document Revised: 01/14/2012 Document Reviewed: 11/15/2007 [employer] Patient Information 2014 [employer].  

## 2013-05-11 ENCOUNTER — Ambulatory Visit (INDEPENDENT_AMBULATORY_CARE_PROVIDER_SITE_OTHER): Payer: 59 | Admitting: Women's Health

## 2013-05-11 ENCOUNTER — Ambulatory Visit (INDEPENDENT_AMBULATORY_CARE_PROVIDER_SITE_OTHER): Payer: 59

## 2013-05-11 ENCOUNTER — Other Ambulatory Visit: Payer: Self-pay | Admitting: Women's Health

## 2013-05-11 ENCOUNTER — Encounter: Payer: Self-pay | Admitting: Women's Health

## 2013-05-11 DIAGNOSIS — N83209 Unspecified ovarian cyst, unspecified side: Secondary | ICD-10-CM

## 2013-05-11 DIAGNOSIS — N83339 Acquired atrophy of ovary and fallopian tube, unspecified side: Secondary | ICD-10-CM

## 2013-05-11 DIAGNOSIS — N949 Unspecified condition associated with female genital organs and menstrual cycle: Secondary | ICD-10-CM

## 2013-05-11 NOTE — Progress Notes (Signed)
Patient ID: April Bowers, female   DOB: 03-12-1945, 68 y.o.   MRN: [id number] Presents for ultrasound. History of asymptomatic persistent left ovarian cyst, 28 x 27 x 21 mm with a mean of 25 mm  in 2014. Postmenopausal with no bleeding no HRT.  Ultrasound: Anteverted uterus with normal echo pattern. Endometrium 1.7 mm. Left ovary not seen. Right ovary atrophic. Excessive bowel activity. Left adnexal thin-walled cystic mass 27 x 21 x 25 mm, 24 mm mean. No change in size. Avascular. Noted thin echogenic focus 2.5 x 0.9 suggesting septum. Negative cul-de-sac.  Unchanged ovarian cyst  Plan: Continue annual check, call if changes, pain, bloating, or changes in elimination.

## 2013-12-12 ENCOUNTER — Encounter: Payer: Self-pay | Admitting: Women's Health

## 2014-01-12 ENCOUNTER — Other Ambulatory Visit: Payer: Self-pay | Admitting: Gastroenterology

## 2014-03-01 ENCOUNTER — Encounter (HOSPITAL_COMMUNITY): Payer: Self-pay | Admitting: *Deleted

## 2014-03-09 ENCOUNTER — Other Ambulatory Visit: Payer: Self-pay | Admitting: Gastroenterology

## 2014-03-13 ENCOUNTER — Ambulatory Visit (HOSPITAL_COMMUNITY): Payer: Medicare Other | Admitting: Anesthesiology

## 2014-03-13 ENCOUNTER — Encounter (HOSPITAL_COMMUNITY): Payer: Self-pay

## 2014-03-13 ENCOUNTER — Encounter (HOSPITAL_COMMUNITY)
Admission: RE | Disposition: A | Discharge status: Home / Self Care | Payer: Self-pay | Source: Ambulatory Visit | Attending: Gastroenterology

## 2014-03-13 ENCOUNTER — Ambulatory Visit (HOSPITAL_COMMUNITY)
Admission: RE | Admit: 2014-03-13 | Discharge: 2014-03-13 | Disposition: A | Discharge status: Home / Self Care | Payer: Medicare Other | Source: Ambulatory Visit | Attending: Gastroenterology | Admitting: Gastroenterology

## 2014-03-13 DIAGNOSIS — M8580 Other specified disorders of bone density and structure, unspecified site: Secondary | ICD-10-CM | POA: Insufficient documentation

## 2014-03-13 DIAGNOSIS — Z1211 Encounter for screening for malignant neoplasm of colon: Secondary | ICD-10-CM | POA: Diagnosis present

## 2014-03-13 DIAGNOSIS — M5136 Other intervertebral disc degeneration, lumbar region: Secondary | ICD-10-CM | POA: Diagnosis not present

## 2014-03-13 DIAGNOSIS — J45909 Unspecified asthma, uncomplicated: Secondary | ICD-10-CM | POA: Diagnosis not present

## 2014-03-13 DIAGNOSIS — Z87891 Personal history of nicotine dependence: Secondary | ICD-10-CM | POA: Diagnosis not present

## 2014-03-13 DIAGNOSIS — I10 Essential (primary) hypertension: Secondary | ICD-10-CM | POA: Insufficient documentation

## 2014-03-13 DIAGNOSIS — M1990 Unspecified osteoarthritis, unspecified site: Secondary | ICD-10-CM | POA: Insufficient documentation

## 2014-03-13 DIAGNOSIS — Z7982 Long term (current) use of aspirin: Secondary | ICD-10-CM | POA: Insufficient documentation

## 2014-03-13 DIAGNOSIS — E039 Hypothyroidism, unspecified: Secondary | ICD-10-CM | POA: Diagnosis not present

## 2014-03-13 HISTORY — DX: Other complications of anesthesia, initial encounter: T88.59XA

## 2014-03-13 HISTORY — PX: COLONOSCOPY WITH PROPOFOL: SHX5780

## 2014-03-13 HISTORY — DX: Adverse effect of unspecified anesthetic, initial encounter: T41.45XA

## 2014-03-13 HISTORY — DX: Diverticulosis of intestine, part unspecified, without perforation or abscess without bleeding: K57.90

## 2014-03-13 HISTORY — DX: Unspecified glaucoma: H40.9

## 2014-03-13 SURGERY — COLONOSCOPY WITH PROPOFOL
Anesthesia: Monitor Anesthesia Care

## 2014-03-13 MED ORDER — SODIUM CHLORIDE 0.9 % IV SOLN: INTRAVENOUS | Status: DC

## 2014-03-13 MED ORDER — PROPOFOL 10 MG/ML IV BOLUS
INTRAVENOUS | Status: AC
  Filled 2014-03-13: qty 20

## 2014-03-13 MED ORDER — LACTATED RINGERS IV SOLN
INTRAVENOUS | Status: DC
  Administered 2014-03-13: 125 mL via INTRAVENOUS

## 2014-03-13 MED ORDER — LACTATED RINGERS IV SOLN
INTRAVENOUS | Status: DC | PRN
  Administered 2014-03-13: 08:00:00 via INTRAVENOUS

## 2014-03-13 MED ORDER — PROPOFOL 10 MG/ML IV BOLUS
INTRAVENOUS | Status: DC | PRN
  Administered 2014-03-13 (×6): 50 mg via INTRAVENOUS

## 2014-03-13 SURGICAL SUPPLY — 22 items

## 2014-03-13 NOTE — Anesthesia Preprocedure Evaluation (Signed)
Anesthesia Evaluation  Patient identified by MRN, date of birth, ID band Patient awake    Reviewed: Allergy & Precautions, NPO status , Patient's Chart, lab work & pertinent test results  Airway Mallampati: II  TM Distance: >3 FB Neck ROM: Full    Dental no notable dental hx.    Pulmonary neg pulmonary ROS, asthma , former smoker,  breath sounds clear to auscultation  Pulmonary exam normal       Cardiovascular hypertension, Pt. on medications negative cardio ROS  Rhythm:Regular Rate:Normal     Neuro/Psych negative neurological ROS  negative psych ROS   GI/Hepatic negative GI ROS, Neg liver ROS,   Endo/Other  negative endocrine ROS  Renal/GU negative Renal ROS  negative genitourinary   Musculoskeletal negative musculoskeletal ROS (+)   Abdominal   Peds negative pediatric ROS (+)  Hematology negative hematology ROS (+)   Anesthesia Other Findings   Reproductive/Obstetrics negative OB ROS                             Anesthesia Physical Anesthesia Plan  ASA: II  Anesthesia Plan: MAC   Post-op Pain Management:    Induction:   Airway Management Planned: Simple Face Mask  Additional Equipment:   Intra-op Plan:   Post-operative Plan:   Informed Consent: I have reviewed the patients History and Physical, chart, labs and discussed the procedure including the risks, benefits and alternatives for the proposed anesthesia with the patient or authorized representative who has indicated his/her understanding and acceptance.   Dental advisory given  Plan Discussed with: CRNA  Anesthesia Plan Comments:         Anesthesia Quick Evaluation

## 2014-03-13 NOTE — Transfer of Care (Signed)
Immediate Anesthesia Transfer of Care Note  Patient: April Bowers  Procedure(s) Performed: Procedure(s): COLONOSCOPY WITH PROPOFOL (N/A)  Patient Location: PACU  Anesthesia Type:MAC  Level of Consciousness: awake, sedated and patient cooperative  Airway & Oxygen Therapy: Patient Spontanous Breathing and Patient connected to face mask oxygen  Post-op Assessment: Report given to RN and Post -op Vital signs reviewed and stable  Post vital signs: Reviewed and stable  Last Vitals:  Filed Vitals:   03/13/14 0727  BP: 166/63  Temp: 36.4 C  Resp: 18    Complications: No apparent anesthesia complications

## 2014-03-13 NOTE — Op Note (Signed)
Procedure: Screening colonoscopy. Normal screening colonoscopy performed on 08/30/2003  Endoscopist: Earle Gell  Premedication: Propofol administered by anesthesia  Procedure: The patient was placed in the left lateral decubitus position. Anal inspection and digital rectal exam were normal. The Pentax pediatric colonoscope was introduced into the rectum and advanced to the cecum. A normal-appearing appendiceal orifice was identified. A normal-appearing valve was intubated and the terminal ileum inspected. Colonic preparation for the exam today was good. Withdrawal time was 10 minutes  Rectum. Normal. Retroflexed view of the distal rectum normal  Sigmoid colon and descending colon. Normal  Splenic flexure. Normal  Transverse colon. Normal  Hepatic flexure. Normal  Ascending colon. Normal  Cecum and ileocecal valve. Normal  Terminal ileum. Normal  Assessment: Normal screening colonoscopy.

## 2014-03-13 NOTE — Discharge Instructions (Signed)

## 2014-03-13 NOTE — H&P (Signed)
  Procedure: Screening colonoscopy. Normal screening colonoscopy performed on 08/30/2003  History: The patient is a 69 year old female born 1946-01-13. She is scheduled to undergo a repeat screening colonoscopy with polypectomy to prevent colon cancer.  Past medical history: Hypertension. Hyperthyroidism treated with radioactive iodine therapy. Hypothyroidism. Seasonal allergic rhinitis. Asthma. Osteoarthritis. Gallbladder polyp. Complex liver cyst. Irritable bowel syndrome. Fibrocystic breast disease. Degenerative disc disease of the lower back. Osteopenia. Left knee surgery.  Medication allergies: Penicillin. Sulfa drugs. Tapazole. PTU.  Exam: The patient is alert and lying comfortably on the endoscopy stretcher. Abdomen is soft and nontender to palpation. Lungs are clear to auscultation. Cardiac exam reveals a regular rhythm.  Plan: Proceed with screening colonoscopy

## 2014-03-13 NOTE — Anesthesia Postprocedure Evaluation (Signed)
  Anesthesia Post-op Note  Patient: April Bowers  Procedure(s) Performed: Procedure(s) (LRB): COLONOSCOPY WITH PROPOFOL (N/A)  Patient Location: PACU  Anesthesia Type: MAC  Level of Consciousness: awake and alert   Airway and Oxygen Therapy: Patient Spontanous Breathing  Post-op Pain: mild  Post-op Assessment: Post-op Vital signs reviewed, Patient's Cardiovascular Status Stable, Respiratory Function Stable, Patent Airway and No signs of Nausea or vomiting  Last Vitals:  Filed Vitals:   03/13/14 0910  BP: 156/70  Pulse: 64  Temp:   Resp: 19    Post-op Vital Signs: stable   Complications: No apparent anesthesia complications

## 2014-03-14 ENCOUNTER — Encounter (HOSPITAL_COMMUNITY): Payer: Self-pay | Admitting: Gastroenterology

## 2014-03-15 ENCOUNTER — Ambulatory Visit (INDEPENDENT_AMBULATORY_CARE_PROVIDER_SITE_OTHER): Payer: Medicare Other | Admitting: Podiatry

## 2014-03-15 ENCOUNTER — Encounter: Payer: Self-pay | Admitting: Podiatry

## 2014-03-15 VITALS — Ht 62.5 in | Wt 157.0 lb

## 2014-03-15 DIAGNOSIS — M2041 Other hammer toe(s) (acquired), right foot: Secondary | ICD-10-CM

## 2014-03-15 DIAGNOSIS — L84 Corns and callosities: Secondary | ICD-10-CM

## 2014-03-15 NOTE — Progress Notes (Signed)
   Subjective:    Patient ID: April Bowers, female    DOB: 1945-08-30, 69 y.o.   MRN: [id number]  HPI Comments: N corn L right 4th interdigital space, 5th toe D 1 month O worsening pain 2 weeks C hard, painful skin A weight bearing and enclosed shoes T Tinactin and padding off  Pt complains of sharp pain when taking certain steps, and has been treating with silicone sleeves for 6 months.  Toe Pain       Review of Systems  Musculoskeletal: Positive for arthralgias.  Allergic/Immunologic: Positive for environmental allergies.  Hematological: Bruises/bleeds easily.  All other systems reviewed and are negative.      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

## 2014-03-15 NOTE — Patient Instructions (Signed)
Apply topical antibiotic ointment or Vaseline to the fourth right webspace at night Wear digital sleeve on fifth right toe during the day Purchase over-the-counter soft arch pad with metatarsal raise

## 2014-03-31 ENCOUNTER — Encounter: Payer: Self-pay | Admitting: Women's Health

## 2014-04-19 ENCOUNTER — Encounter: Payer: Self-pay | Admitting: Women's Health

## 2014-04-19 ENCOUNTER — Ambulatory Visit (INDEPENDENT_AMBULATORY_CARE_PROVIDER_SITE_OTHER): Payer: Medicare Other | Admitting: Women's Health

## 2014-04-19 VITALS — BP 128/80 | Ht 62.0 in | Wt 162.0 lb

## 2014-04-19 DIAGNOSIS — Z13820 Encounter for screening for osteoporosis: Secondary | ICD-10-CM

## 2014-04-19 DIAGNOSIS — Z01419 Encounter for gynecological examination (general) (routine) without abnormal findings: Secondary | ICD-10-CM

## 2014-04-19 DIAGNOSIS — N8320 Unspecified ovarian cysts: Secondary | ICD-10-CM | POA: Diagnosis not present

## 2014-04-19 DIAGNOSIS — M8580 Other specified disorders of bone density and structure, unspecified site: Secondary | ICD-10-CM | POA: Diagnosis not present

## 2014-04-19 DIAGNOSIS — N83202 Unspecified ovarian cyst, left side: Secondary | ICD-10-CM

## 2014-04-19 NOTE — Patient Instructions (Signed)

## 2014-04-19 NOTE — Progress Notes (Signed)
April Bowers [birth year] [id number]    History:    Presents for annual exam.  Postmenopausal/no bleeding/no HRT. Normal Pap and mammogram history. Negative colonoscopy 03/2014. Osteopenia stable June 02, 2012 T score -1.7 left femoral neck FRAX 16%/2.2%. Had taken Fosamax, Boniva, Actonel for several years had numerous side effects of joint and muscle pain. Hypertension/hypothyroid  primary care manages. Current on vaccines.  Past medical history, past surgical history, family history and social history were all reviewed and documented in the EPIC chart. 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 Only child [name] died in 06-03-2011 age 26 from accident.   ROS:  A ROS was performed and pertinent positives and negatives are included.  Exam:  Filed Vitals:   04/19/14 0826  BP: 128/80    General appearance:  Normal Thyroid:  Symmetrical, normal in size, without palpable masses or nodularity. Respiratory  Auscultation:  Clear without wheezing or rhonchi Cardiovascular  Auscultation:  Regular rate, without rubs, murmurs or gallops  Edema/varicosities:  Not grossly evident Abdominal  Soft,nontender, without masses, guarding or rebound.  Liver/spleen:  No organomegaly noted  Hernia:  None appreciated  Skin  Inspection:  Grossly normal   Breasts: Examined lying and sitting.     Right: Without masses, retractions, discharge or axillary adenopathy.     Left: Without masses, retractions, discharge or axillary adenopathy. Gentitourinary   Inguinal/mons:  Normal without inguinal adenopathy  External genitalia:  Normal  BUS/Urethra/Skene's glands:  Normal  Vagina:  Normal  Cervix:  Normal  Uterus:  normal in size, shape and contour.  Midline and mobile  Adnexa/parametria:     Rt: Without masses or tenderness.   Lt: Without masses or tenderness.  Anus and perineum: Normal  Digital rectal exam: Normal sphincter tone without palpated masses or tenderness  Assessment/Plan:  69 y.o. MWF  G3P1- deceased for annual exam with no complaints.  Osteopenia without elevated FRAX Postmenopausal/no bleeding/no HRT Hypothyroid/hypertension-primary care manages labs and meds Persistent 24 mm left ovarian cyst  Plan: Ultrasound, will schedule. Repeat DEXA, will schedule. Home safety, fall prevention and importance of regular weightbearing exercise reviewed. SBE's, continue annual screening mammogram, calcium rich diet, vitamin D 1000 daily encouraged.(normal vit D). UA, Pap screening guidelines reviewed.  YOUNG,NANCY J WHNP, 1:32 PM 04/19/2014

## 2014-04-20 LAB — URINALYSIS W MICROSCOPIC + REFLEX CULTURE
Bacteria, UA: NONE SEEN
Bilirubin Urine: NEGATIVE
CASTS: NONE SEEN
Crystals: NONE SEEN
Glucose, UA: NEGATIVE mg/dL
Hgb urine dipstick: NEGATIVE
Ketones, ur: NEGATIVE mg/dL
Nitrite: NEGATIVE
Protein, ur: NEGATIVE mg/dL
SQUAMOUS EPITHELIAL / LPF: NONE SEEN
Specific Gravity, Urine: 1.016 (ref 1.005–1.030)
Urobilinogen, UA: 0.2 mg/dL (ref 0.0–1.0)
pH: 7 (ref 5.0–8.0)

## 2014-04-21 ENCOUNTER — Telehealth: Payer: Self-pay

## 2014-04-21 LAB — URINE CULTURE: Colony Count: >=100000

## 2014-04-21 NOTE — Telephone Encounter (Signed)
Patient just wanted to let you know that she went to see Dr. Nelva Bush today about her sciatic pain and he did x-rays and put her on prednisone.  She just wanted you to know.

## 2014-05-04 ENCOUNTER — Ambulatory Visit (INDEPENDENT_AMBULATORY_CARE_PROVIDER_SITE_OTHER): Payer: Medicare Other

## 2014-05-04 ENCOUNTER — Ambulatory Visit (INDEPENDENT_AMBULATORY_CARE_PROVIDER_SITE_OTHER): Payer: Medicare Other | Admitting: Women's Health

## 2014-05-04 DIAGNOSIS — N83202 Unspecified ovarian cyst, left side: Principal | ICD-10-CM

## 2014-05-04 DIAGNOSIS — R1909 Other intra-abdominal and pelvic swelling, mass and lump: Secondary | ICD-10-CM | POA: Diagnosis not present

## 2014-05-04 DIAGNOSIS — N8320 Unspecified ovarian cysts: Secondary | ICD-10-CM

## 2014-05-04 DIAGNOSIS — N83201 Unspecified ovarian cyst, right side: Secondary | ICD-10-CM

## 2014-05-04 NOTE — Patient Instructions (Signed)
Ovarian Cyst An ovarian cyst is a fluid-filled sac that forms on an ovary. The ovaries are small organs that produce eggs in women. Various types of cysts can form on the ovaries. Most are not cancerous. Many do not cause problems, and they often go away on their own. Some may cause symptoms and require treatment. Common types of ovarian cysts include:  Functional cysts--These cysts may occur every month during the menstrual cycle. This is normal. The cysts usually go away with the next menstrual cycle if the woman does not get pregnant. Usually, there are no symptoms with a functional cyst.  Endometrioma cysts--These cysts form from the tissue that lines the uterus. They are also called "chocolate cysts" because they become filled with blood that turns brown. This type of cyst can cause pain in the lower abdomen during intercourse and with your menstrual period.  Cystadenoma cysts--This type develops from the cells on the outside of the ovary. These cysts can get very big and cause lower abdomen pain and pain with intercourse. This type of cyst can twist on itself, cut off its blood supply, and cause severe pain. It can also easily rupture and cause a lot of pain.  Dermoid cysts--This type of cyst is sometimes found in both ovaries. These cysts may contain different kinds of body tissue, such as skin, teeth, hair, or cartilage. They usually do not cause symptoms unless they get very big.  Theca lutein cysts--These cysts occur when too much of a certain hormone (human chorionic gonadotropin) is produced and overstimulates the ovaries to produce an egg. This is most common after procedures used to assist with the conception of a baby (in vitro fertilization). CAUSES   Fertility drugs can cause a condition in which multiple large cysts are formed on the ovaries. This is called ovarian hyperstimulation syndrome.  A condition called polycystic ovary syndrome can cause hormonal imbalances that can lead to  nonfunctional ovarian cysts. SIGNS AND SYMPTOMS  Many ovarian cysts do not cause symptoms. If symptoms are present, they may include:  Pelvic pain or pressure.  Pain in the lower abdomen.  Pain during sexual intercourse.  Increasing girth (swelling) of the abdomen.  Abnormal menstrual periods.  Increasing pain with menstrual periods.  Stopping having menstrual periods without being pregnant. DIAGNOSIS  These cysts are commonly found during a routine or annual pelvic exam. Tests may be ordered to find out more about the cyst. These tests may include:  Ultrasound.  X-ray of the pelvis.  CT scan.  MRI.  Blood tests. TREATMENT  Many ovarian cysts go away on their own without treatment. Your health care provider may want to check your cyst regularly for 2-3 months to see if it changes. For women in menopause, it is particularly important to monitor a cyst closely because of the higher rate of ovarian cancer in menopausal women. When treatment is needed, it may include any of the following:  A procedure to drain the cyst (aspiration). This may be done using a long needle and ultrasound. It can also be done through a laparoscopic procedure. This involves using a thin, lighted tube with a tiny camera on the end (laparoscope) inserted through a small incision.  Surgery to remove the whole cyst. This may be done using laparoscopic surgery or an open surgery involving a larger incision in the lower abdomen.  Hormone treatment or birth control pills. These methods are sometimes used to help dissolve a cyst. HOME CARE INSTRUCTIONS   Only take over-the-counter   or prescription medicines as directed by your health care provider.  Follow up with your health care provider as directed.  Get regular pelvic exams and Pap tests. SEEK MEDICAL CARE IF:   Your periods are late, irregular, or painful, or they stop.  Your pelvic pain or abdominal pain does not go away.  Your abdomen becomes  larger or swollen.  You have pressure on your bladder or trouble emptying your bladder completely.  You have pain during sexual intercourse.  You have feelings of fullness, pressure, or discomfort in your stomach.  You lose weight for no apparent reason.  You feel generally ill.  You become constipated.  You lose your appetite.  You develop acne.  You have an increase in body and facial hair.  You are gaining weight, without changing your exercise and eating habits.  You think you are pregnant. SEEK IMMEDIATE MEDICAL CARE IF:   You have increasing abdominal pain.  You feel sick to your stomach (nauseous), and you throw up (vomit).  You develop a fever that comes on suddenly.  You have abdominal pain during a bowel movement.  Your menstrual periods become heavier than usual. MAKE SURE YOU:  Understand these instructions.  Will watch your condition.  Will get help right away if you are not doing well or get worse. Document Released: 01/27/2005 Document Revised: 02/01/2013 Document Reviewed: 10/04/2012 Exit[employer] Patient Information 2015 [employer]. This information is not intended to replace advice given to you by your health care provider. Make sure you discuss any questions you have with your health care provider.  

## 2014-05-04 NOTE — Progress Notes (Signed)
Patient ID: [name], female   DOB: 07-07-1945, 69 y.o.   MRN: [id number] Presents for ultrasound. Has had a persistent 24 mm left ovarian cyst. Without complaint. Postmenopausal/no HRT no bleeding.  Ultrasound: No uterine abnormalities seen. Endometrium 2 mm. Right ovarian echo-free thin-walled avascular cyst 5 x 4 x 4 mm. Left adnexal echo-free thin-walled  avascular cystic mass 27 x 22 x 24 mm mean 24 mm no change from ultrasound 05/2013. No septum seen on today's exam. No free fluid noted.  Persistent left ovarian cyst no change Small right ovarian cyst  Plan: CA 125. Recheck ultrasound 3 months to  check stability.

## 2014-05-05 LAB — CA 125: CA 125: 12 U/mL (ref <35)

## 2014-06-27 ENCOUNTER — Encounter: Payer: Self-pay | Admitting: Gynecology

## 2014-06-27 ENCOUNTER — Other Ambulatory Visit: Payer: Self-pay | Admitting: Gynecology

## 2014-06-27 ENCOUNTER — Other Ambulatory Visit: Payer: Self-pay | Admitting: Women's Health

## 2014-06-27 ENCOUNTER — Ambulatory Visit (INDEPENDENT_AMBULATORY_CARE_PROVIDER_SITE_OTHER): Payer: Medicare Other

## 2014-06-27 DIAGNOSIS — M8580 Other specified disorders of bone density and structure, unspecified site: Secondary | ICD-10-CM

## 2014-06-27 DIAGNOSIS — Z13820 Encounter for screening for osteoporosis: Secondary | ICD-10-CM

## 2014-06-30 ENCOUNTER — Telehealth: Payer: Self-pay | Admitting: *Deleted

## 2014-06-30 DIAGNOSIS — M8580 Other specified disorders of bone density and structure, unspecified site: Secondary | ICD-10-CM

## 2014-06-30 NOTE — Telephone Encounter (Signed)
Per recent Dexa order pt will need to have Vitamin  D level drawn pt said she would to have it done here rather than PCP office. I explained to pt insurance may not pay for this she was fine and said place order this was done pt will be in on 07/07/14 @ 11:00am

## 2014-07-07 ENCOUNTER — Other Ambulatory Visit: Payer: Medicare Other

## 2014-07-07 DIAGNOSIS — M8580 Other specified disorders of bone density and structure, unspecified site: Secondary | ICD-10-CM

## 2014-07-08 LAB — VITAMIN D 25 HYDROXY (VIT D DEFICIENCY, FRACTURES): VIT D 25 HYDROXY: 28 ng/mL — AB (ref 30–100)

## 2014-08-11 ENCOUNTER — Other Ambulatory Visit: Payer: Medicare Other

## 2014-08-11 ENCOUNTER — Ambulatory Visit: Payer: Medicare Other | Admitting: Women's Health

## 2014-08-16 ENCOUNTER — Ambulatory Visit (INDEPENDENT_AMBULATORY_CARE_PROVIDER_SITE_OTHER): Payer: Medicare Other

## 2014-08-16 ENCOUNTER — Encounter: Payer: Self-pay | Admitting: Women's Health

## 2014-08-16 ENCOUNTER — Ambulatory Visit (INDEPENDENT_AMBULATORY_CARE_PROVIDER_SITE_OTHER): Payer: Medicare Other | Admitting: Women's Health

## 2014-08-16 VITALS — BP 130/78 | Ht 62.0 in | Wt 162.0 lb

## 2014-08-16 DIAGNOSIS — N8320 Unspecified ovarian cysts: Secondary | ICD-10-CM

## 2014-08-16 DIAGNOSIS — N83202 Unspecified ovarian cyst, left side: Principal | ICD-10-CM

## 2014-08-16 DIAGNOSIS — R1909 Other intra-abdominal and pelvic swelling, mass and lump: Secondary | ICD-10-CM

## 2014-08-16 DIAGNOSIS — N83201 Unspecified ovarian cyst, right side: Secondary | ICD-10-CM

## 2014-08-16 NOTE — Progress Notes (Signed)
Presents for ultrasound for follow-up on 04/2014 right ovarian cyst 5 x 4 mm. Has had a persistent left thin-walled ovarian cyst 27 x 2 x 24 for many years. CA 125  12. Pain free.  Exam: Appears well. Ultrasound: T/V images anteverted uterus with fibroid no change. Right ovary normal. Previous cyst not seen. Left ovary atrophic. Left adnexal thin-walled echo-free cyst 30 x 20 x 26 no change. Negative CFD. Negative CDS.  Resolved right ovarian cyst. Persistent no change left ovarian cyst  Plan: We'll continue annual surveillance. Instructed to call if changes in elimination, bloating, or pain.

## 2015-04-09 ENCOUNTER — Encounter: Payer: Self-pay | Admitting: Women's Health

## 2015-04-20 ENCOUNTER — Ambulatory Visit (INDEPENDENT_AMBULATORY_CARE_PROVIDER_SITE_OTHER): Payer: Medicare Other | Admitting: Women's Health

## 2015-04-20 ENCOUNTER — Encounter: Payer: Self-pay | Admitting: Women's Health

## 2015-04-20 VITALS — BP 122/70 | Ht 62.0 in | Wt 160.0 lb

## 2015-04-20 DIAGNOSIS — M899 Disorder of bone, unspecified: Secondary | ICD-10-CM | POA: Diagnosis not present

## 2015-04-20 DIAGNOSIS — N83202 Unspecified ovarian cyst, left side: Secondary | ICD-10-CM

## 2015-04-20 DIAGNOSIS — M8580 Other specified disorders of bone density and structure, unspecified site: Secondary | ICD-10-CM

## 2015-04-20 NOTE — Progress Notes (Signed)
April Bowers 1946/01/12 [id number]    History:    Presents for breast and pelvic exam. Postmenopausal on no HRT with no bleeding. History of a persistent left ovarian cyst 30 x 20 x 26 with no change for years. Normal Pap and mammogram history. 2016 negative colonoscopy. DEXA 2016 T score -2.3 right femoral neck, FRAX 11%/2.1%. Has been on Fosamax, Boniva and Actonel with numerous side effects unable to tolerate. Hypertension hypothyroidism managed by primary care. Current on vaccines.   Past medical history, past surgical history, family history and social history were all reviewed and documented in the EPIC chart. Retired Chief Financial Officer. Only child [name] died in an accident at age 44. Very active in preschool [employer] study.  ROS:  A ROS was performed and pertinent positives and negatives are included.  Exam:  Filed Vitals:   04/20/15 0833  BP: 122/70    General appearance:  Normal Thyroid:  Symmetrical, normal in size, without palpable masses or nodularity. Respiratory  Auscultation:  Clear without wheezing or rhonchi Cardiovascular  Auscultation:  Regular rate, without rubs, murmurs or gallops  Edema/varicosities:  Not grossly evident Abdominal  Soft,nontender, without masses, guarding or rebound.  Liver/spleen:  No organomegaly noted  Hernia:  None appreciated  Skin  Inspection:  Grossly normal   Breasts: Examined lying and sitting.     Right: Without masses, retractions, discharge or axillary adenopathy.     Left: Without masses, retractions, discharge or axillary adenopathy. Gentitourinary   Inguinal/mons:  Normal without inguinal adenopathy  External genitalia:  Normal  BUS/Urethra/Skene's glands:  Normal  Vagina:  Normal  Cervix:  Normal  Uterus:  normal in size, shape and contour.  Midline and mobile  Adnexa/parametria:     Rt: Without masses or tenderness.   Lt: Without masses or tenderness.  Anus and perineum: Normal  Digital rectal exam: Normal sphincter tone without  palpated masses or tenderness  Assessment/Plan:  70 y.o. MWF G1 P1 breast and pelvic exam with no complaints.  Persistent left ovarian cyst   30 x 20 x 26 m Postmenopausal on no HRT with no bleeding Osteopenia without elevated FRAX Hypertension/hyperthyroidism primary care manages labs and meds  Plan: Repeat ultrasound in July. Prefers monitoring. SBE's, continue annual 3-D screening mammogram. Home safety, fall prevention and importance of continuing weightbearing exercise. Exercises several times weekly, does yoga wt resistant exercise . Vitamin D 2000 daily, calcium rich diet, low-fat diet reviewed. Normal Pap history reviewed new screening guidelines.  [address], 1:05 PM 04/20/2015

## 2015-04-20 NOTE — Patient Instructions (Signed)

## 2015-08-17 ENCOUNTER — Ambulatory Visit (INDEPENDENT_AMBULATORY_CARE_PROVIDER_SITE_OTHER): Payer: Medicare Other | Admitting: Women's Health

## 2015-08-17 ENCOUNTER — Encounter: Payer: Self-pay | Admitting: Women's Health

## 2015-08-17 ENCOUNTER — Ambulatory Visit (INDEPENDENT_AMBULATORY_CARE_PROVIDER_SITE_OTHER): Payer: Medicare Other

## 2015-08-17 VITALS — BP 132/80

## 2015-08-17 DIAGNOSIS — M8580 Other specified disorders of bone density and structure, unspecified site: Secondary | ICD-10-CM

## 2015-08-17 DIAGNOSIS — N83202 Unspecified ovarian cyst, left side: Secondary | ICD-10-CM

## 2015-08-17 DIAGNOSIS — M899 Disorder of bone, unspecified: Secondary | ICD-10-CM

## 2015-08-17 NOTE — Progress Notes (Signed)
Patient ID: April Bowers, female   DOB: [birth year], 70 y.o.   MRN: [id number] Presents for ultrasound to follow-up persistent left ovarian cyst. Cyst was found coincidentally greater than 10 years ago. 2016 left ovarian cyst 30 x 20 x 26 mm. History of low vitamin D. Postmenopausal with no bleeding on no HRT.  Exam: Appears well. Ultrasound: T/V anteverted uterus with fundal fibroid 7 x 5 mm. Right ovary normal echo atrophic. Left ovary atrophic. Thin-walled echo-free cyst 24 x 26 x 21 mm avascular. No significant change, negative cul-de-sac. Endometrium 1.5 mm.  Persistent left ovarian cyst slightly smaller  Plan: Vitamin D, Ca 125. Reviewed if CA 125 negative and since cyst has been stable for several years we'll not repeat ultrasound or CA-125 unless becomes symptomatic.

## 2015-08-18 LAB — VITAMIN D 25 HYDROXY (VIT D DEFICIENCY, FRACTURES): VIT D 25 HYDROXY: 35 ng/mL (ref 30–100)

## 2015-08-18 LAB — CA 125: CA 125: 9 U/mL (ref <35)

## 2016-04-02 ENCOUNTER — Encounter: Payer: Self-pay | Admitting: Women's Health

## 2016-04-23 ENCOUNTER — Encounter: Payer: Self-pay | Admitting: Women's Health

## 2016-04-23 ENCOUNTER — Ambulatory Visit (INDEPENDENT_AMBULATORY_CARE_PROVIDER_SITE_OTHER): Payer: Medicare Other | Admitting: Women's Health

## 2016-04-23 VITALS — BP 129/83 | Ht 62.0 in | Wt 158.2 lb

## 2016-04-23 DIAGNOSIS — E2839 Other primary ovarian failure: Secondary | ICD-10-CM

## 2016-04-23 DIAGNOSIS — M810 Age-related osteoporosis without current pathological fracture: Secondary | ICD-10-CM | POA: Diagnosis not present

## 2016-04-23 DIAGNOSIS — Z01419 Encounter for gynecological examination (general) (routine) without abnormal findings: Secondary | ICD-10-CM | POA: Diagnosis not present

## 2016-04-23 NOTE — Progress Notes (Signed)
April Bowers 10-11-1945 [id number]    History:    Presents for breast and pelvic exam. Postmenopausal on no HRT with no bleeding. Hypertension managed by primary care. History of a persistent small left ovarian cyst with low CA-125 for the last 15 years with no change. 2016 negative colonoscopy. 06/2014 T score -1.9 FRAX 11%/2.1% stable. Had tried Fosamax, Actonel in the past had numerous side effects.  Past medical history, past surgical history, family history and social history were all reviewed and documented in the EPIC chart. 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 Travels and attends numerous fundraiser's.  ROS:  A ROS was performed and pertinent positives and negatives are included.  Exam:  Vitals:   04/23/16 0844  BP: 129/83  Weight: 158 lb 3.2 oz (71.8 kg)  Height: 5\' 2"  (1.575 m)   Body mass index is 28.94 kg/m.   General appearance:  Normal Thyroid:  Symmetrical, normal in size, without palpable masses or nodularity. Respiratory  Auscultation:  Clear without wheezing or rhonchi Cardiovascular  Auscultation:  Regular rate, without rubs, murmurs or gallops  Edema/varicosities:  Not grossly evident Abdominal  Soft,nontender, without masses, guarding or rebound.  Liver/spleen:  No organomegaly noted  Hernia:  None appreciated  Skin  Inspection:  Grossly normal   Breasts: Examined lying and sitting.     Right: Without masses, retractions, discharge or axillary adenopathy.     Left: Without masses, retractions, discharge or axillary adenopathy. Gentitourinary   Inguinal/mons:  Normal without inguinal adenopathy  External genitalia:  Normal  BUS/Urethra/Skene's glands:  Normal Diverticula noted at urethra  Vagina:  Normal  Cervix:  Normal  Uterus:   normal in size, shape and contour.  Midline and mobile  Adnexa/parametria:     Rt: Without masses or tenderness.   Lt: Without masses or tenderness.  Anus and perineum: Normal  Digital rectal  exam: Normal sphincter tone without palpated masses or tenderness  Assessment/Plan:  71 y.o. MWF G3 P1, daughter deceased from accident for breast and pelvic exam with no complaints.  Postmenopausal/no HRT/no bleeding/not sexually active husbands health Hypertension-primary care manages labs and meds Osteopenia history of a fractured rib, no elevated FRAX History of a persistent left ovarian cyst with low CA-125 for greater than 15 years no change  Plan: Repeat DEXA. Home safety, fall prevention and importance of continuing exercise, yoga. SBE's, continue annual screening mammogram, calcium rich diet, vitamin D 2000 daily encouraged. Reviewed importance of returning to office if change with bloating, fullness, urinary or bowel symptoms for repeat ultrasound.    [place], 10:52 AM 04/23/2016

## 2016-04-23 NOTE — Patient Instructions (Signed)
Health Maintenance for Postmenopausal Women Menopause is a normal process in which your reproductive ability comes to an end. This process happens gradually over a span of months to years, usually between the ages of 33 and 38. Menopause is complete when you have missed 12 consecutive menstrual periods. It is important to talk with your health care provider about some of the most common conditions that affect postmenopausal women, such as heart disease, cancer, and bone loss (osteoporosis). Adopting a healthy lifestyle and getting preventive care can help to promote your health and wellness. Those actions can also lower your chances of developing some of these common conditions. What should I know about menopause? During menopause, you may experience a number of symptoms, such as:  Moderate-to-severe hot flashes.  Night sweats.  Decrease in sex drive.  Mood swings.  Headaches.  Tiredness.  Irritability.  Memory problems.  Insomnia. Choosing to treat or not to treat menopausal changes is an individual decision that you make with your health care provider. What should I know about hormone replacement therapy and supplements? Hormone therapy products are effective for treating symptoms that are associated with menopause, such as hot flashes and night sweats. Hormone replacement carries certain risks, especially as you become older. If you are thinking about using estrogen or estrogen with progestin treatments, discuss the benefits and risks with your health care provider. What should I know about heart disease and stroke? Heart disease, heart attack, and stroke become more likely as you age. This may be due, in part, to the hormonal changes that your body experiences during menopause. These can affect how your body processes dietary fats, triglycerides, and cholesterol. Heart attack and stroke are both medical emergencies. There are many things that you can do to help prevent heart disease  and stroke:  Have your blood pressure checked at least every 1-2 years. High blood pressure causes heart disease and increases the risk of stroke.  If you are 73-73 years old, ask your health care provider if you should take aspirin to prevent a heart attack or a stroke.  Do not use any tobacco products, including cigarettes, chewing tobacco, or electronic cigarettes. If you need help quitting, ask your health care provider.  It is important to eat a healthy diet and maintain a healthy weight.  Be sure to include plenty of vegetables, fruits, low-fat dairy products, and lean protein.  Avoid eating foods that are high in solid fats, added sugars, or salt (sodium).  Get regular exercise. This is one of the most important things that you can do for your health.  Try to exercise for at least 150 minutes each week. The type of exercise that you do should increase your heart rate and make you sweat. This is known as moderate-intensity exercise.  Try to do strengthening exercises at least twice each week. Do these in addition to the moderate-intensity exercise.  Know your numbers.Ask your health care provider to check your cholesterol and your blood glucose. Continue to have your blood tested as directed by your health care provider. What should I know about cancer screening? There are several types of cancer. Take the following steps to reduce your risk and to catch any cancer development as early as possible. Breast Cancer  Practice breast self-awareness.  This means understanding how your breasts normally appear and feel.  It also means doing regular breast self-exams. Let your health care provider know about any changes, no matter how small.  If you are 40 or older,  have a clinician do a breast exam (clinical breast exam or CBE) every year. Depending on your age, family history, and medical history, it may be recommended that you also have a yearly breast X-ray (mammogram).  If you  have a family history of breast cancer, talk with your health care provider about genetic screening.  If you are at high risk for breast cancer, talk with your health care provider about having an MRI and a mammogram every year.  Breast cancer (BRCA) gene test is recommended for women who have family members with BRCA-related cancers. Results of the assessment will determine the need for genetic counseling and BRCA1 and for BRCA2 testing. BRCA-related cancers include these types:  Breast. This occurs in males or females.  Ovarian.  Tubal. This may also be called fallopian tube cancer.  Cancer of the abdominal or pelvic lining (peritoneal cancer).  Prostate.  Pancreatic. Cervical, Uterine, and Ovarian Cancer  Your health care provider may recommend that you be screened regularly for cancer of the pelvic organs. These include your ovaries, uterus, and vagina. This screening involves a pelvic exam, which includes checking for microscopic changes to the surface of your cervix (Pap test).  For women ages 21-65, health care providers may recommend a pelvic exam and a Pap test every three years. For women ages 23-65, they may recommend the Pap test and pelvic exam, combined with testing for human papilloma virus (HPV), every five years. Some types of HPV increase your risk of cervical cancer. Testing for HPV may also be done on women of any age who have unclear Pap test results.  Other health care providers may not recommend any screening for nonpregnant women who are considered low risk for pelvic cancer and have no symptoms. Ask your health care provider if a screening pelvic exam is right for you.  If you have had past treatment for cervical cancer or a condition that could lead to cancer, you need Pap tests and screening for cancer for at least 20 years after your treatment. If Pap tests have been discontinued for you, your risk factors (such as having a new sexual partner) need to be reassessed  to determine if you should start having screenings again. Some women have medical problems that increase the chance of getting cervical cancer. In these cases, your health care provider may recommend that you have screening and Pap tests more often.  If you have a family history of uterine cancer or ovarian cancer, talk with your health care provider about genetic screening.  If you have vaginal bleeding after reaching menopause, tell your health care provider.  There are currently no reliable tests available to screen for ovarian cancer. Lung Cancer  Lung cancer screening is recommended for adults 99-83 years old who are at high risk for lung cancer because of a history of smoking. A yearly low-dose CT scan of the lungs is recommended if you:  Currently smoke.  Have a history of at least 30 pack-years of smoking and you currently smoke or have quit within the past 15 years. A pack-year is smoking an average of one pack of cigarettes per day for one year. Yearly screening should:  Continue until it has been 15 years since you quit.  Stop if you develop a health problem that would prevent you from having lung cancer treatment. Colorectal Cancer  This type of cancer can be detected and can often be prevented.  Routine colorectal cancer screening usually begins at age 72 and continues  through age 75.  If you have risk factors for colon cancer, your health care provider may recommend that you be screened at an earlier age.  If you have a family history of colorectal cancer, talk with your health care provider about genetic screening.  Your health care provider may also recommend using home test kits to check for hidden blood in your stool.  A small camera at the end of a tube can be used to examine your colon directly (sigmoidoscopy or colonoscopy). This is done to check for the earliest forms of colorectal cancer.  Direct examination of the colon should be repeated every 5-10 years until  age 75. However, if early forms of precancerous polyps or small growths are found or if you have a family history or genetic risk for colorectal cancer, you may need to be screened more often. Skin Cancer  Check your skin from head to toe regularly.  Monitor any moles. Be sure to tell your health care provider:  About any new moles or changes in moles, especially if there is a change in a mole's shape or color.  If you have a mole that is larger than the size of a pencil eraser.  If any of your family members has a history of skin cancer, especially at a Kadejah Sandiford age, talk with your health care provider about genetic screening.  Always use sunscreen. Apply sunscreen liberally and repeatedly throughout the day.  Whenever you are outside, protect yourself by wearing long sleeves, pants, a wide-brimmed hat, and sunglasses. What should I know about osteoporosis? Osteoporosis is a condition in which bone destruction happens more quickly than new bone creation. After menopause, you may be at an increased risk for osteoporosis. To help prevent osteoporosis or the bone fractures that can happen because of osteoporosis, the following is recommended:  If you are 19-50 years old, get at least 1,000 mg of calcium and at least 600 mg of vitamin D per day.  If you are older than age 50 but younger than age 70, get at least 1,200 mg of calcium and at least 600 mg of vitamin D per day.  If you are older than age 70, get at least 1,200 mg of calcium and at least 800 mg of vitamin D per day. Smoking and excessive alcohol intake increase the risk of osteoporosis. Eat foods that are rich in calcium and vitamin D, and do weight-bearing exercises several times each week as directed by your health care provider. What should I know about how menopause affects my mental health? Depression may occur at any age, but it is more common as you become older. Common symptoms of depression include:  Low or sad  mood.  Changes in sleep patterns.  Changes in appetite or eating patterns.  Feeling an overall lack of motivation or enjoyment of activities that you previously enjoyed.  Frequent crying spells. Talk with your health care provider if you think that you are experiencing depression. What should I know about immunizations? It is important that you get and maintain your immunizations. These include:  Tetanus, diphtheria, and pertussis (Tdap) booster vaccine.  Influenza every year before the flu season begins.  Pneumonia vaccine.  Shingles vaccine. Your health care provider may also recommend other immunizations. This information is not intended to replace advice given to you by your health care provider. Make sure you discuss any questions you have with your health care provider. Document Released: 03/21/2005 Document Revised: 08/17/2015 Document Reviewed: 10/31/2014 Elsevier Interactive Patient   Education  2017 [employer]  

## 2016-06-19 ENCOUNTER — Other Ambulatory Visit: Payer: Self-pay | Admitting: Gynecology

## 2016-06-19 DIAGNOSIS — M810 Age-related osteoporosis without current pathological fracture: Secondary | ICD-10-CM

## 2016-06-19 DIAGNOSIS — E2839 Other primary ovarian failure: Secondary | ICD-10-CM

## 2016-06-25 ENCOUNTER — Encounter: Payer: Self-pay | Admitting: Gynecology

## 2016-07-01 ENCOUNTER — Ambulatory Visit (INDEPENDENT_AMBULATORY_CARE_PROVIDER_SITE_OTHER): Payer: Medicare Other

## 2016-07-01 ENCOUNTER — Other Ambulatory Visit: Payer: Self-pay | Admitting: Gynecology

## 2016-07-01 DIAGNOSIS — Z780 Asymptomatic menopausal state: Secondary | ICD-10-CM

## 2016-07-01 DIAGNOSIS — E2839 Other primary ovarian failure: Secondary | ICD-10-CM

## 2016-07-01 DIAGNOSIS — Z87310 Personal history of (healed) osteoporosis fracture: Secondary | ICD-10-CM

## 2016-07-01 DIAGNOSIS — M8589 Other specified disorders of bone density and structure, multiple sites: Secondary | ICD-10-CM

## 2016-07-01 DIAGNOSIS — M810 Age-related osteoporosis without current pathological fracture: Secondary | ICD-10-CM

## 2016-07-08 ENCOUNTER — Telehealth: Payer: Self-pay | Admitting: Gynecology

## 2016-07-08 ENCOUNTER — Other Ambulatory Visit: Payer: Self-pay | Admitting: Gynecology

## 2016-07-08 ENCOUNTER — Encounter: Payer: Self-pay | Admitting: Gynecology

## 2016-07-08 DIAGNOSIS — Z87310 Personal history of (healed) osteoporosis fracture: Secondary | ICD-10-CM

## 2016-07-08 DIAGNOSIS — M810 Age-related osteoporosis without current pathological fracture: Secondary | ICD-10-CM

## 2016-07-08 NOTE — Telephone Encounter (Signed)
Tell patient her most recent bone density was stable from her prior study. She does have a history of spontaneous rib fracture with coughing which technically puts her into the osteoporotic range and the question is whether to treat her with medication. I know she has tried Fosamax and Actonel in the past. If she would be interested in considering Prolia then recommend office visit to discuss. I would recommend that she had a recent vitamin D level checked either through her primary physician's office or our office to make sure she is in the therapeutic range. If she is not interested in initiating medication then I would recommend repeating a bone density in 2 years.

## 2016-07-09 NOTE — Telephone Encounter (Signed)
Left message for pt to call.

## 2016-07-22 NOTE — Telephone Encounter (Signed)
Left message for pt to call.

## 2016-07-23 NOTE — Telephone Encounter (Signed)
Pt informed

## 2016-11-06 ENCOUNTER — Ambulatory Visit
Admission: RE | Admit: 2016-11-06 | Discharge: 2016-11-06 | Disposition: A | Discharge status: Home / Self Care | Payer: Medicare Other | Source: Ambulatory Visit | Attending: Internal Medicine | Admitting: Internal Medicine

## 2016-11-06 ENCOUNTER — Other Ambulatory Visit: Payer: Self-pay | Admitting: Internal Medicine

## 2016-11-06 DIAGNOSIS — M545 Low back pain: Secondary | ICD-10-CM

## 2017-02-24 DIAGNOSIS — M25562 Pain in left knee: Secondary | ICD-10-CM | POA: Insufficient documentation

## 2017-03-01 ENCOUNTER — Encounter (HOSPITAL_COMMUNITY): Payer: Self-pay | Admitting: Emergency Medicine

## 2017-03-01 ENCOUNTER — Other Ambulatory Visit: Payer: Self-pay

## 2017-03-01 ENCOUNTER — Ambulatory Visit (HOSPITAL_COMMUNITY)
Admission: EM | Admit: 2017-03-01 | Discharge: 2017-03-01 | Disposition: A | Discharge status: Home / Self Care | Payer: Medicare Other | Attending: Internal Medicine | Admitting: Internal Medicine

## 2017-03-01 DIAGNOSIS — R062 Wheezing: Secondary | ICD-10-CM | POA: Diagnosis not present

## 2017-03-01 DIAGNOSIS — R0602 Shortness of breath: Secondary | ICD-10-CM

## 2017-03-01 DIAGNOSIS — J4521 Mild intermittent asthma with (acute) exacerbation: Secondary | ICD-10-CM | POA: Diagnosis not present

## 2017-03-01 MED ORDER — IPRATROPIUM-ALBUTEROL 0.5-2.5 (3) MG/3ML IN SOLN
3.0000 mL | Freq: Once | RESPIRATORY_TRACT | Status: AC
  Administered 2017-03-01: 3 mL via RESPIRATORY_TRACT

## 2017-03-01 MED ORDER — METHYLPREDNISOLONE SODIUM SUCC 125 MG IJ SOLR
80.0000 mg | Freq: Once | INTRAMUSCULAR | Status: AC
  Administered 2017-03-01: 80 mg via INTRAMUSCULAR

## 2017-03-01 MED ORDER — IPRATROPIUM-ALBUTEROL 0.5-2.5 (3) MG/3ML IN SOLN
RESPIRATORY_TRACT | Status: AC
  Filled 2017-03-01: qty 3

## 2017-03-01 MED ORDER — METHYLPREDNISOLONE SODIUM SUCC 125 MG IJ SOLR
INTRAMUSCULAR | Status: AC
  Filled 2017-03-01: qty 2

## 2017-03-01 NOTE — ED Provider Notes (Signed)
[facility]    CSN: [id number] Arrival date & time: 03/01/17  1213     History   Chief Complaint Chief Complaint  Patient presents with  . Asthma    HPI April Bowers is a 72 y.o. female.   72 year old female with history of HTN, asthma, hypothyroidism, osteoporosis comes in for a few day history of URI symptoms with exacerbation of her asthma.  She has had mild productive cough, rhinorrhea, nasal congestion.  Denies fever, chills, night sweats.  Denies chest pain, leg swelling.  She has had increased use of her albuterol without relief.  She has been on low-dose prednisone for her knee, Mucinex, albuterol with little relief.  Former smoker, 6 years, 1 pack/week.  Patient states she was waiting to see if low-dose prednisone could help with her asthma, but has continued to have wheezing and shortness of breath.      Past Medical History:  Diagnosis Date  . Complication of anesthesia    took much longer to awaken from anesthesia-45 yrs ago,last colonoscopy not a problem few yrs ago  . Diverticulosis   . Glaucoma    bilateral  . Hypertension   . Hypothyroidism   . Osteoporosis    Based on history of spontaneous rib fracture with coughing. DEXA 06/2016 T score -2.2 stable from prior DEXA 2016    Patient Active Problem List   Diagnosis Date Noted  . Essential hypertension, benign 04/15/2013  . Ovarian cyst   . Osteopenia   . Asthma   . Hypothyroidism     Past Surgical History:  Procedure Laterality Date  . COLONOSCOPY WITH PROPOFOL N/A 03/13/2014   Procedure: COLONOSCOPY WITH PROPOFOL;  Surgeon: Garlan Fair, MD;  Location: [facility];  Service: Endoscopy;  Laterality: N/A;  . DILATION AND CURETTAGE OF UTERUS     MISSED AB  . KNEE SURGERY Left    left knee scope    OB History    Gravida Para Term Preterm AB Living   3 1 1   2  0   SAB TAB Ectopic Multiple Live Births                   Home Medications    Prior to Admission medications     Medication Sig Start Date End Date Taking? Authorizing Provider  amLODipine (NORVASC) 10 MG tablet Take 10 mg by mouth at bedtime.    [provider]  aspirin EC 81 MG tablet Take 81 mg by mouth daily.    [provider]  BIOTIN PO Take 1 tablet by mouth daily.    [provider]  cholecalciferol (VITAMIN D) 1000 units tablet Take 1,000 Units by mouth daily.    [provider]  Cholecalciferol (VITAMIN D3) 2000 units TABS Take by mouth.    [provider]  CINNAMON PO Take 1 tablet by mouth daily.    [provider]  Glucosamine HCl-Glucosamin SO4 (GLUCOSAMINE COMPLEX PO) Take 1 tablet by mouth daily.     [provider]  latanoprost (XALATAN) 0.005 % ophthalmic solution Place 1 drop into both eyes at bedtime.    [provider]  levothyroxine (SYNTHROID, LEVOTHROID) 100 MCG tablet Take 100 mcg by mouth daily before breakfast.    [provider]  levothyroxine (SYNTHROID, LEVOTHROID) 88 MCG tablet Take 88 mcg by mouth daily before breakfast.    [provider]  Multiple Vitamin (MULTIVITAMIN WITH MINERALS) TABS tablet Take 1 tablet by mouth daily.  [provider]  Omega-3 Fatty Acids (FISH OIL PO) Take 1 capsule by mouth daily.     [provider]    Family History Family History  Problem Relation Age of Onset  . Hypertension Mother   . Heart disease Mother        CVA-DECEASED  . Heart disease Sister   . Hypertension Father   . Hypertension Sister   . Diabetes Maternal Grandmother     Social History Social History   Tobacco Use  . Smoking status: Former Smoker    Last attempt to quit: 03/01/1970    Years since quitting: 47.0  . Smokeless tobacco: Never Used  Substance Use Topics  . Alcohol use: Yes    Alcohol/week: 0.0 oz    Comment: rarely 1-2 month  . Drug use: No     Allergies   Sulfa antibiotics and Penicillins   Review of Systems Review of Systems   Reason unable to perform ROS: See HPI as above.     Physical Exam Triage Vital Signs ED Triage Vitals [03/01/17 1237]  Enc Vitals Group     BP (!) 185/93     Pulse Rate 65     Resp 18     Temp 98.1 F (36.7 C)     Temp Source Oral     SpO2 99 %     Weight      Height      Head Circumference      Peak Flow      Pain Score      Pain Loc      Pain Edu?      Excl. in [place]?    No data found.  Updated Vital Signs BP (!) 185/93 (BP Location: Left Arm) Comment: Nurse Kenton Kingfisher aware of BP  Pulse 65   Temp 98.1 F (36.7 C) (Oral)   Resp 18   SpO2 99%   Physical Exam  Constitutional: She is oriented to person, place, and time. She appears well-developed and well-nourished. No distress.  HENT:  Head: Normocephalic and atraumatic.  Right Ear: Tympanic membrane, external ear and ear canal normal. Tympanic membrane is not erythematous and not bulging.  Left Ear: Tympanic membrane, external ear and ear canal normal. Tympanic membrane is not erythematous and not bulging.  Nose: Nose normal. Right sinus exhibits no maxillary sinus tenderness and no frontal sinus tenderness. Left sinus exhibits no maxillary sinus tenderness and no frontal sinus tenderness.  Mouth/Throat: Uvula is midline, oropharynx is clear and moist and mucous membranes are normal.  Eyes: Conjunctivae are normal. Pupils are equal, round, and reactive to light.  Neck: Normal range of motion. Neck supple.  Cardiovascular: Normal rate, regular rhythm and normal heart sounds. Exam reveals no gallop and no friction rub.  No murmur heard. Pulmonary/Chest: Effort normal.  Decreased air movement without obvious adventitious lung sounds.  Lymphadenopathy:    She has no cervical adenopathy.  Neurological: She is alert and oriented to person, place, and time.  Skin: Skin is warm and dry.  Psychiatric: She has a normal mood and affect. Her behavior is normal. Judgment normal.   UC Treatments / Results  Labs (all labs  ordered are listed, but only abnormal results are displayed) Labs Reviewed - No data to display  EKG  EKG Interpretation None       Radiology No results found.  Procedures Procedures (including critical care time)  Medications Ordered in UC Medications  ipratropium-albuterol (DUONEB) 0.5-2.5 (3) MG/3ML nebulizer  solution 3 mL (3 mLs Nebulization Given 03/01/17 1318)  methylPREDNISolone sodium succinate (SOLU-MEDROL) 125 mg/2 mL injection 80 mg (80 mg Intramuscular Given 03/01/17 1311)     Initial Impression / Assessment and Plan / UC Course  I have reviewed the triage vital signs and the nursing notes.  Pertinent labs & imaging results that were available during my care of the patient were reviewed by me and considered in my medical decision making (see chart for details).    Patient with much improved air movement without wheezing or other adventitious lung sounds after DuoNeb. Patient speaking better, states feeling better as well. Solu-Medrol injection in office today. Given patient already on low-dose prednisone, will have patient continue and follow-up with PCP for further evaluation needed.  Return precautions given.  Patient expresses understanding and agrees to plan.  Final Clinical Impressions(s) / UC Diagnoses   Final diagnoses:  Mild intermittent asthma with exacerbation    ED Discharge Orders    None        Ok Edwards, PA-C 03/01/17 1353

## 2017-03-01 NOTE — Discharge Instructions (Signed)
We gave you a DuoNeb nebulizer treatment, and Solu-Medrol 80 mg today.  Continue your prednisone as directed.  Follow-up with primary care for further  evaluation needed.  Monitor for worsening of symptoms, chest pain, shortness of breath, fever, follow-up for reevaluation.

## 2017-03-01 NOTE — ED Triage Notes (Signed)
Pt c/o asthma flair up, tried to see her doctor for breathing treatment but unable to due to the weekend. Pt using home inhaler without relief. Pt requesting breathing treatment and shot.

## 2017-04-27 ENCOUNTER — Encounter: Payer: Self-pay | Admitting: Women's Health

## 2017-04-27 ENCOUNTER — Ambulatory Visit (INDEPENDENT_AMBULATORY_CARE_PROVIDER_SITE_OTHER): Payer: Medicare Other | Admitting: Women's Health

## 2017-04-27 VITALS — BP 118/80 | Ht 62.0 in | Wt 154.0 lb

## 2017-04-27 DIAGNOSIS — E559 Vitamin D deficiency, unspecified: Secondary | ICD-10-CM | POA: Diagnosis not present

## 2017-04-27 DIAGNOSIS — Z01419 Encounter for gynecological examination (general) (routine) without abnormal findings: Secondary | ICD-10-CM

## 2017-04-27 DIAGNOSIS — N83202 Unspecified ovarian cyst, left side: Secondary | ICD-10-CM

## 2017-04-27 NOTE — Progress Notes (Signed)
April Bowers January 24, 1946 [id number]    History:    Presents for breast and pelvic exam with no complaints.  Normal Pap and mammogram history. 2018 T score -2.2 left femoral neck FRAX 13%/3.2% stable, on no medication. History of a rib  fracture. Continues to do yoga 3 times weekly and regular exercise.  History of a persistent small 3 cm left ovarian cyst with low CA-125. Primary care manages hypertension, hypothyroidism. Glaucoma.  2016 negative colonoscopy. Current on vaccines.  Not sexually active husbands health.  Past medical history, past surgical history, family history and social history were all reviewed and documented in the EPIC chart. Continues to travel and attends fundraiser's. Only child [name] died at age 72 from an accident.   ROS:  A ROS was performed and pertinent positives and negatives are included.  Exam:  Vitals:   04/27/17 1407  BP: 118/80  Weight: 154 lb (69.9 kg)  Height: 5\' 2"  (1.575 m)   Body mass index is 28.17 kg/m.   General appearance:  Normal Thyroid:  Symmetrical, normal in size, without palpable masses or nodularity. Respiratory  Auscultation:  Clear without wheezing or rhonchi Cardiovascular  Auscultation:  Regular rate, without rubs, murmurs or gallops  Edema/varicosities:  Not grossly evident Abdominal  Soft,nontender, without masses, guarding or rebound.  Liver/spleen:  No organomegaly noted  Hernia:  None appreciated  Skin  Inspection:  Grossly normal   Breasts: Examined lying and sitting.     Right: Without masses, retractions, discharge or axillary adenopathy.     Left: Without masses, retractions, discharge or axillary adenopathy. Gentitourinary   Inguinal/mons:  Normal without inguinal adenopathy  External genitalia:  Normal  BUS/Urethra/Skene's glands:  Normal  Vagina:  Normal  Cervix:  Normal  Uterus:   normal in size, shape and contour.  Midline and mobile  Adnexa/parametria:     Rt: Without masses or tenderness.   Lt: Without  masses or tenderness.  Anus and perineum: Normal  Digital rectal exam: Normal sphincter tone without palpated masses or tenderness  Assessment/Plan:  72 y.o.MWF G3 P1/ deceased for breast and pelvic exam.  Postmenopausal/no HRT/no bleeding Persistent left ovarian cyst Osteopenia with elevated FRAX Hypertension, hypothyroidism - primary care manages labs and meds Glaucoma  Plan: Vitamin D, CA-125. Repeat ultrasound to check stability. SBE's, continue annual screening mammogram, vitamin D 2000 daily encouraged. Home safety, fall prevention and importance of continuing yoga, weightbearing exercise reviewed. Encouraged to continue leisure activities, self-care.     [place], 4:59 PM 04/27/2017

## 2017-04-27 NOTE — Patient Instructions (Signed)
Health Maintenance for Postmenopausal Women Menopause is a normal process in which your reproductive ability comes to an end. This process happens gradually over a span of months to years, usually between the ages of 22 and 9. Menopause is complete when you have missed 12 consecutive menstrual periods. It is important to talk with your health care provider about some of the most common conditions that affect postmenopausal women, such as heart disease, cancer, and bone loss (osteoporosis). Adopting a healthy lifestyle and getting preventive care can help to promote your health and wellness. Those actions can also lower your chances of developing some of these common conditions. What should I know about menopause? During menopause, you may experience a number of symptoms, such as:  Moderate-to-severe hot flashes.  Night sweats.  Decrease in sex drive.  Mood swings.  Headaches.  Tiredness.  Irritability.  Memory problems.  Insomnia.  Choosing to treat or not to treat menopausal changes is an individual decision that you make with your health care provider. What should I know about hormone replacement therapy and supplements? Hormone therapy products are effective for treating symptoms that are associated with menopause, such as hot flashes and night sweats. Hormone replacement carries certain risks, especially as you become older. If you are thinking about using estrogen or estrogen with progestin treatments, discuss the benefits and risks with your health care provider. What should I know about heart disease and stroke? Heart disease, heart attack, and stroke become more likely as you age. This may be due, in part, to the hormonal changes that your body experiences during menopause. These can affect how your body processes dietary fats, triglycerides, and cholesterol. Heart attack and stroke are both medical emergencies. There are many things that you can do to help prevent heart disease  and stroke:  Have your blood pressure checked at least every 1-2 years. High blood pressure causes heart disease and increases the risk of stroke.  If you are 53-22 years old, ask your health care provider if you should take aspirin to prevent a heart attack or a stroke.  Do not use any tobacco products, including cigarettes, chewing tobacco, or electronic cigarettes. If you need help quitting, ask your health care provider.  It is important to eat a healthy diet and maintain a healthy weight. ? Be sure to include plenty of vegetables, fruits, low-fat dairy products, and lean protein. ? Avoid eating foods that are high in solid fats, added sugars, or salt (sodium).  Get regular exercise. This is one of the most important things that you can do for your health. ? Try to exercise for at least 150 minutes each week. The type of exercise that you do should increase your heart rate and make you sweat. This is known as moderate-intensity exercise. ? Try to do strengthening exercises at least twice each week. Do these in addition to the moderate-intensity exercise.  Know your numbers.Ask your health care provider to check your cholesterol and your blood glucose. Continue to have your blood tested as directed by your health care provider.  What should I know about cancer screening? There are several types of cancer. Take the following steps to reduce your risk and to catch any cancer development as early as possible. Breast Cancer  Practice breast self-awareness. ? This means understanding how your breasts normally appear and feel. ? It also means doing regular breast self-exams. Let your health care provider know about any changes, no matter how small.  If you are 40  or older, have a clinician do a breast exam (clinical breast exam or CBE) every year. Depending on your age, family history, and medical history, it may be recommended that you also have a yearly breast X-ray (mammogram).  If you  have a family history of breast cancer, talk with your health care provider about genetic screening.  If you are at high risk for breast cancer, talk with your health care provider about having an MRI and a mammogram every year.  Breast cancer (BRCA) gene test is recommended for women who have family members with BRCA-related cancers. Results of the assessment will determine the need for genetic counseling and BRCA1 and for BRCA2 testing. BRCA-related cancers include these types: ? Breast. This occurs in males or females. ? Ovarian. ? Tubal. This may also be called fallopian tube cancer. ? Cancer of the abdominal or pelvic lining (peritoneal cancer). ? Prostate. ? Pancreatic.  Cervical, Uterine, and Ovarian Cancer Your health care provider may recommend that you be screened regularly for cancer of the pelvic organs. These include your ovaries, uterus, and vagina. This screening involves a pelvic exam, which includes checking for microscopic changes to the surface of your cervix (Pap test).  For women ages 21-65, health care providers may recommend a pelvic exam and a Pap test every three years. For women ages 79-65, they may recommend the Pap test and pelvic exam, combined with testing for human papilloma virus (HPV), every five years. Some types of HPV increase your risk of cervical cancer. Testing for HPV may also be done on women of any age who have unclear Pap test results.  Other health care providers may not recommend any screening for nonpregnant women who are considered low risk for pelvic cancer and have no symptoms. Ask your health care provider if a screening pelvic exam is right for you.  If you have had past treatment for cervical cancer or a condition that could lead to cancer, you need Pap tests and screening for cancer for at least 20 years after your treatment. If Pap tests have been discontinued for you, your risk factors (such as having a new sexual partner) need to be  reassessed to determine if you should start having screenings again. Some women have medical problems that increase the chance of getting cervical cancer. In these cases, your health care provider may recommend that you have screening and Pap tests more often.  If you have a family history of uterine cancer or ovarian cancer, talk with your health care provider about genetic screening.  If you have vaginal bleeding after reaching menopause, tell your health care provider.  There are currently no reliable tests available to screen for ovarian cancer.  Lung Cancer Lung cancer screening is recommended for adults 69-62 years old who are at high risk for lung cancer because of a history of smoking. A yearly low-dose CT scan of the lungs is recommended if you:  Currently smoke.  Have a history of at least 30 pack-years of smoking and you currently smoke or have quit within the past 15 years. A pack-year is smoking an average of one pack of cigarettes per day for one year.  Yearly screening should:  Continue until it has been 15 years since you quit.  Stop if you develop a health problem that would prevent you from having lung cancer treatment.  Colorectal Cancer  This type of cancer can be detected and can often be prevented.  Routine colorectal cancer screening usually begins at  age 42 and continues through age 45.  If you have risk factors for colon cancer, your health care provider may recommend that you be screened at an earlier age.  If you have a family history of colorectal cancer, talk with your health care provider about genetic screening.  Your health care provider may also recommend using home test kits to check for hidden blood in your stool.  A small camera at the end of a tube can be used to examine your colon directly (sigmoidoscopy or colonoscopy). This is done to check for the earliest forms of colorectal cancer.  Direct examination of the colon should be repeated every  5-10 years until age 71. However, if early forms of precancerous polyps or small growths are found or if you have a family history or genetic risk for colorectal cancer, you may need to be screened more often.  Skin Cancer  Check your skin from head to toe regularly.  Monitor any moles. Be sure to tell your health care provider: ? About any new moles or changes in moles, especially if there is a change in a mole's shape or color. ? If you have a mole that is larger than the size of a pencil eraser.  If any of your family members has a history of skin cancer, especially at a Angely Dietz age, talk with your health care provider about genetic screening.  Always use sunscreen. Apply sunscreen liberally and repeatedly throughout the day.  Whenever you are outside, protect yourself by wearing long sleeves, pants, a wide-brimmed hat, and sunglasses.  What should I know about osteoporosis? Osteoporosis is a condition in which bone destruction happens more quickly than new bone creation. After menopause, you may be at an increased risk for osteoporosis. To help prevent osteoporosis or the bone fractures that can happen because of osteoporosis, the following is recommended:  If you are 46-71 years old, get at least 1,000 mg of calcium and at least 600 mg of vitamin D per day.  If you are older than age 55 but younger than age 65, get at least 1,200 mg of calcium and at least 600 mg of vitamin D per day.  If you are older than age 54, get at least 1,200 mg of calcium and at least 800 mg of vitamin D per day.  Smoking and excessive alcohol intake increase the risk of osteoporosis. Eat foods that are rich in calcium and vitamin D, and do weight-bearing exercises several times each week as directed by your health care provider. What should I know about how menopause affects my mental health? Depression may occur at any age, but it is more common as you become older. Common symptoms of depression  include:  Low or sad mood.  Changes in sleep patterns.  Changes in appetite or eating patterns.  Feeling an overall lack of motivation or enjoyment of activities that you previously enjoyed.  Frequent crying spells.  Talk with your health care provider if you think that you are experiencing depression. What should I know about immunizations? It is important that you get and maintain your immunizations. These include:  Tetanus, diphtheria, and pertussis (Tdap) booster vaccine.  Influenza every year before the flu season begins.  Pneumonia vaccine.  Shingles vaccine.  Your health care provider may also recommend other immunizations. This information is not intended to replace advice given to you by your health care provider. Make sure you discuss any questions you have with your health care provider. Document Released: 03/21/2005  Document Revised: 08/17/2015 Document Reviewed: 10/31/2014 Elsevier Interactive Patient Education  2018 Elsevier Inc.  

## 2017-04-28 LAB — VITAMIN D 25 HYDROXY (VIT D DEFICIENCY, FRACTURES): Vit D, 25-Hydroxy: 50 ng/mL (ref 30–100)

## 2017-04-28 LAB — CA 125: CA 125: 9 U/mL (ref <35)

## 2017-05-06 ENCOUNTER — Encounter: Payer: Self-pay | Admitting: Women's Health

## 2017-05-06 ENCOUNTER — Ambulatory Visit (INDEPENDENT_AMBULATORY_CARE_PROVIDER_SITE_OTHER): Payer: Medicare Other

## 2017-05-06 ENCOUNTER — Ambulatory Visit: Payer: Medicare Other | Admitting: Women's Health

## 2017-05-06 VITALS — BP 130/80

## 2017-05-06 DIAGNOSIS — N83202 Unspecified ovarian cyst, left side: Secondary | ICD-10-CM

## 2017-05-06 NOTE — Progress Notes (Signed)
72 year old MWF G3 P1/deceased presents for follow-up ultrasound.  History of persistent left ovarian cyst.  Asymptomatic.  Last ultrasound 2017 left ovarian cyst less than 3 cm Ca 125  9.  Medical problems include hypertension, osteopenia and glaucoma.  Denies any r concerns today denies urinary symptoms, vaginal discharge or abdominal pain.  Postmenopausal on no HRT with no bleeding.  Dam: Appears well.  Ultrasound: T/V uterus anteverted homogeneous.  Endometrium 3 mm.  Endometrium within normal limits.  Right ovary normal.  Left ovary thin-walled echo-free avascular cyst 26 x 23 x 20 mm, negative CFD.  Negative cul-de-sac.  Persistent small left ovarian cyst with low Ca 125  Plan: Reviewed no change in left ovarian cyst, we will continue to check CA 125 annually and ultrasound every other year.  Instructed to call or return if problems.

## 2017-09-17 DIAGNOSIS — M1812 Unilateral primary osteoarthritis of first carpometacarpal joint, left hand: Secondary | ICD-10-CM | POA: Insufficient documentation

## 2018-04-09 ENCOUNTER — Encounter: Payer: Self-pay | Admitting: Women's Health

## 2018-04-29 ENCOUNTER — Other Ambulatory Visit: Payer: Self-pay

## 2018-05-03 ENCOUNTER — Encounter: Payer: Medicare Other | Admitting: Women's Health

## 2018-06-01 ENCOUNTER — Encounter: Payer: Medicare Other | Admitting: Women's Health

## 2018-06-22 ENCOUNTER — Other Ambulatory Visit: Payer: Self-pay

## 2018-06-23 ENCOUNTER — Encounter: Payer: Self-pay | Admitting: Women's Health

## 2018-06-23 ENCOUNTER — Ambulatory Visit (INDEPENDENT_AMBULATORY_CARE_PROVIDER_SITE_OTHER): Payer: Medicare Other | Admitting: Women's Health

## 2018-06-23 VITALS — BP 126/72 | Ht 61.5 in | Wt 142.0 lb

## 2018-06-23 DIAGNOSIS — Z01419 Encounter for gynecological examination (general) (routine) without abnormal findings: Secondary | ICD-10-CM | POA: Diagnosis not present

## 2018-06-23 DIAGNOSIS — N83202 Unspecified ovarian cyst, left side: Secondary | ICD-10-CM

## 2018-06-23 DIAGNOSIS — N83201 Unspecified ovarian cyst, right side: Secondary | ICD-10-CM | POA: Diagnosis not present

## 2018-06-23 DIAGNOSIS — Z13820 Encounter for screening for osteoporosis: Secondary | ICD-10-CM | POA: Diagnosis not present

## 2018-06-23 DIAGNOSIS — E2839 Other primary ovarian failure: Secondary | ICD-10-CM | POA: Diagnosis not present

## 2018-06-23 NOTE — Patient Instructions (Signed)
Health Maintenance After Age 75 After age 75, you are at a higher risk for certain long-term diseases and infections as well as injuries from falls. Falls are a major cause of broken bones and head injuries in people who are older than age 75. Getting regular preventive care can help to keep you healthy and well. Preventive care includes getting regular testing and making lifestyle changes as recommended by your health care provider. Talk with your health care provider about:  Which screenings and tests you should have. A screening is a test that checks for a disease when you have no symptoms.  A diet and exercise plan that is right for you. What should I know about screenings and tests to prevent falls? Screening and testing are the best ways to find a health problem early. Early diagnosis and treatment give you the best chance of managing medical conditions that are common after age 75. Certain conditions and lifestyle choices may make you more likely to have a fall. Your health care provider may recommend:  Regular vision checks. Poor vision and conditions such as cataracts can make you more likely to have a fall. If you wear glasses, make sure to get your prescription updated if your vision changes.  Medicine review. Work with your health care provider to regularly review all of the medicines you are taking, including over-the-counter medicines. Ask your health care provider about any side effects that may make you more likely to have a fall. Tell your health care provider if any medicines that you take make you feel dizzy or sleepy.  Osteoporosis screening. Osteoporosis is a condition that causes the bones to get weaker. This can make the bones weak and cause them to break more easily.  Blood pressure screening. Blood pressure changes and medicines to control blood pressure can make you feel dizzy.  Strength and balance checks. Your health care provider may recommend certain tests to check your  strength and balance while standing, walking, or changing positions.  Foot health exam. Foot pain and numbness, as well as not wearing proper footwear, can make you more likely to have a fall.  Depression screening. You may be more likely to have a fall if you have a fear of falling, feel emotionally low, or feel unable to do activities that you used to do.  Alcohol use screening. Using too much alcohol can affect your balance and may make you more likely to have a fall. What actions can I take to lower my risk of falls? General instructions  Talk with your health care provider about your risks for falling. Tell your health care provider if: ? You fall. Be sure to tell your health care provider about all falls, even ones that seem minor. ? You feel dizzy, sleepy, or off-balance.  Take over-the-counter and prescription medicines only as told by your health care provider. These include any supplements.  Eat a healthy diet and maintain a healthy weight. A healthy diet includes low-fat dairy products, low-fat (lean) meats, and fiber from whole grains, beans, and lots of fruits and vegetables. Home safety  Remove any tripping hazards, such as rugs, cords, and clutter.  Install safety equipment such as grab bars in bathrooms and safety rails on stairs.  Keep rooms and walkways well-lit. Activity   Follow a regular exercise program to stay fit. This will help you maintain your balance. Ask your health care provider what types of exercise are appropriate for you.  If you need a cane or   walker, use it as recommended by your health care provider.  Wear supportive shoes that have nonskid soles. Lifestyle  Do not drink alcohol if your health care provider tells you not to drink.  If you drink alcohol, limit how much you have: ? 0-1 drink a day for women. ? 0-2 drinks a day for men.  Be aware of how much alcohol is in your drink. In the [place] one drink equals one typical bottle of beer (12  oz), one-half glass of wine (5 oz), or one shot of hard liquor (1 oz).  Do not use any products that contain nicotine or tobacco, such as cigarettes and e-cigarettes. If you need help quitting, ask your health care provider. Summary  Having a healthy lifestyle and getting preventive care can help to protect your health and wellness after age 75.  Screening and testing are the best way to find a health problem early and help you avoid having a fall. Early diagnosis and treatment give you the best chance for managing medical conditions that are more common for people who are older than age 75.  Falls are a major cause of broken bones and head injuries in people who are older than age 75. Take precautions to prevent a fall at home.  Work with your health care provider to learn what changes you can make to improve your health and wellness and to prevent falls. This information is not intended to replace advice given to you by your health care provider. Make sure you discuss any questions you have with your health care provider. Document Released: 12/10/2016 Document Revised: 12/10/2016 Document Reviewed: 12/10/2016 Elsevier Interactive Patient Education  2019 Elsevier Inc.  

## 2018-06-23 NOTE — Progress Notes (Signed)
RHYTHM GUBBELS 06-11-1945 [id number]    History:    Presents for breast and pelvic exam.  Postmenopausal on no HRT with no bleeding.  Normal Pap and mammogram history.  Has had a persistent 3 cm ovarian cyst with low Ca 125 of 9.  2018 T score -2.2 femoral neck FRAX 13% / 3.2%.  History of rib fracture.  Primary care manages hypertension, hypothyroidism.  Glaucoma.  2016- colonoscopy.  Current on vaccines.  Weight is down 12 pounds from last year with weight watchers.  Past medical history, past surgical history, family history and social history were all reviewed and documented in the EPIC chart.  Only child [name] died  at age 47 from a traumatic fall with head injury,  established a [employer] scholarship in her name.  Not sexually active, husband's health, also having an ear problem causing balance issues.  Active in the community with fundraisers and enjoys travel.  ROS:  A ROS was performed and pertinent positives and negatives are included.  Exam:  Vitals:   06/23/18 1400  BP: 126/72  Weight: 142 lb (64.4 kg)  Height: 5' 1.5" (1.562 m)   Body mass index is 26.4 kg/m.   General appearance:  Normal Thyroid:  Symmetrical, normal in size, without palpable masses or nodularity. Respiratory  Auscultation:  Clear without wheezing or rhonchi Cardiovascular  Auscultation:  Regular rate, without rubs, murmurs or gallops  Edema/varicosities:  Not grossly evident Abdominal  Soft,nontender, without masses, guarding or rebound.  Liver/spleen:  No organomegaly noted  Hernia:  None appreciated  Skin  Inspection:  Grossly normal   Breasts: Examined lying and sitting.     Right: Without masses, retractions, discharge or axillary adenopathy.     Left: Without masses, retractions, discharge or axillary adenopathy. Gentitourinary   Inguinal/mons:  Normal without inguinal adenopathy  External genitalia:  Normal  BUS/Urethra/Skene's glands:  Normal  Vagina:  Normal  Cervix:  Normal  Uterus:  normal  in size, shape and contour.  Midline and mobile  Adnexa/parametria:     Rt: Without masses or tenderness.   Lt: Without masses or tenderness.  Anus and perineum: Normal  Digital rectal exam: Normal sphincter tone without palpated masses or tenderness  Assessment/Plan:  73 y.o. WF G1, P1 deceased for breast and pelvic exam with no complaints.  Postmenopausal/no HRT/no bleeding Persistent 3 cm left ovarian cyst low Ca 125 Hypothyroidism, hypertension, primary care manages labs and meds Glaucoma- ophthalmologist manages Osteopenia without elevated FRAX  Plan: Repeat DEXA, continue weightbearing and balance type exercise practices yoga 5 times weekly.  Aware of home safety, fall prevention.  SBEs, continue annual screening mammogram has had at [facility].  Vitamin D 2000 daily, calcium rich foods encouraged.  Normal Pap history, new screening guidelines reviewed.  Schedule ultrasound, history of persistent 3 cm left ovarian cyst.  Congratulated on the weight loss and healthy lifestyle.    Huel C[facility], 2:06 PM 06/23/2018

## 2018-07-13 ENCOUNTER — Other Ambulatory Visit: Payer: Self-pay

## 2018-07-14 ENCOUNTER — Ambulatory Visit (INDEPENDENT_AMBULATORY_CARE_PROVIDER_SITE_OTHER): Payer: Medicare Other | Admitting: Women's Health

## 2018-07-14 ENCOUNTER — Encounter: Payer: Self-pay | Admitting: Women's Health

## 2018-07-14 ENCOUNTER — Ambulatory Visit (INDEPENDENT_AMBULATORY_CARE_PROVIDER_SITE_OTHER): Payer: Medicare Other

## 2018-07-14 VITALS — BP 122/82

## 2018-07-14 DIAGNOSIS — N83202 Unspecified ovarian cyst, left side: Secondary | ICD-10-CM

## 2018-07-14 DIAGNOSIS — N898 Other specified noninflammatory disorders of vagina: Secondary | ICD-10-CM | POA: Diagnosis not present

## 2018-07-14 LAB — WET PREP FOR TRICH, YEAST, CLUE

## 2018-07-14 MED ORDER — NYSTATIN-TRIAMCINOLONE 100000-0.1 UNIT/GM-% EX OINT: 1.0000 "application " | TOPICAL_OINTMENT | Freq: Two times a day (BID) | CUTANEOUS | 1 refills | Status: DC

## 2018-07-14 NOTE — Progress Notes (Signed)
73 year old MWF G3 P1 deceased presents for ultrasound.  Has had a history of persistent left ovarian cyst, low Ca 125.  2019 ovarian cyst 26 x 23 x 20 mm.   Reports vaginal irritation with external itching without odor or visible discharge.  Not sexually active.  Denies urinary symptoms, changes in bowel elimination, bloating,  indigestion or abdominal pain.  Medical problems include hypertension, hypothyroidism and glaucoma.  Exercises most days of the week, wearing spandex.  Exam: Appears well.  No CVAT.  Abdomen soft, nontender, external genitalia mild erythema at introitus posterior fornix, wet prep done with a Q-tip negative. Ultrasound: T/V uterus anteverted intramural fibroids 7 x 9 mm.  Right ovary normal.  Endometrium within normal limits 1.5 mm.  Left ovary thin-walled echo-free cyst 23 x 24 x 23 mm, negative CFD.  Negative cul-de-sac.  Vaginal irritation Persistent left ovarian cyst/ slightly smaller  Plan: Ultrasound reviewed, reassurance given regarding ovarian cyst slightly smaller.  Will continue to ultrasound annually, instructed to call if changes in abdominal discomfort/bloating/bowel or bladder elimination.  Options for vaginal irritation reviewed, will try small amount of Mycolog externally BID for several days, will call if continued problems with irritation/itching, open to air, loose clothing, changing after exercise.

## 2018-08-05 ENCOUNTER — Other Ambulatory Visit: Payer: Self-pay

## 2018-08-05 ENCOUNTER — Ambulatory Visit (INDEPENDENT_AMBULATORY_CARE_PROVIDER_SITE_OTHER): Payer: Medicare Other

## 2018-08-05 DIAGNOSIS — M810 Age-related osteoporosis without current pathological fracture: Secondary | ICD-10-CM

## 2018-08-05 DIAGNOSIS — Z13820 Encounter for screening for osteoporosis: Secondary | ICD-10-CM

## 2018-08-05 DIAGNOSIS — E2839 Other primary ovarian failure: Secondary | ICD-10-CM

## 2018-08-11 DIAGNOSIS — M810 Age-related osteoporosis without current pathological fracture: Secondary | ICD-10-CM

## 2018-08-11 HISTORY — DX: Age-related osteoporosis without current pathological fracture: M81.0

## 2018-08-12 ENCOUNTER — Other Ambulatory Visit: Payer: Self-pay | Admitting: Gynecology

## 2018-08-12 ENCOUNTER — Other Ambulatory Visit: Payer: Self-pay | Admitting: Women's Health

## 2018-08-12 DIAGNOSIS — M810 Age-related osteoporosis without current pathological fracture: Secondary | ICD-10-CM

## 2018-08-16 ENCOUNTER — Encounter: Payer: Self-pay | Admitting: Gynecology

## 2018-08-16 ENCOUNTER — Telehealth: Payer: Self-pay | Admitting: Gynecology

## 2018-08-16 NOTE — Telephone Encounter (Signed)
Left message for pt to call.

## 2018-08-16 NOTE — Telephone Encounter (Signed)
Patient informed, transferred to appointment desk  

## 2018-08-16 NOTE — Telephone Encounter (Signed)
Tell patient that her bone density shows osteoporosis.  She has had some loss at her spine and hip region.  Recommend office visit to discuss the bone density results and treatment options.

## 2018-08-19 ENCOUNTER — Other Ambulatory Visit: Payer: Self-pay

## 2018-08-20 ENCOUNTER — Ambulatory Visit (INDEPENDENT_AMBULATORY_CARE_PROVIDER_SITE_OTHER): Payer: Medicare Other | Admitting: Gynecology

## 2018-08-20 ENCOUNTER — Encounter: Payer: Self-pay | Admitting: Gynecology

## 2018-08-20 VITALS — BP 122/74

## 2018-08-20 DIAGNOSIS — M810 Age-related osteoporosis without current pathological fracture: Secondary | ICD-10-CM | POA: Diagnosis not present

## 2018-08-20 NOTE — Patient Instructions (Signed)
Follow-up for your vitamin D level.

## 2018-08-20 NOTE — Progress Notes (Signed)
° ° °  April Bowers Jun 12, 1945 [id number]        73 y.o.  G3P1020 presents for discussion of her most recent bone density showing osteoporosis T score -2.5.  Past medical history,surgical history, problem list, medications, allergies, family history and social history were all reviewed and documented in the EPIC chart.  Directed ROS with pertinent positives and negatives documented in the history of present illness/assessment and plan.  Exam: Vitals:   08/20/18 1404  BP: 122/74   General appearance:  Normal  Assessment/Plan:  73 y.o. G3P1020 with osteoporosis on her most recent bone density.  T score -2.5.  5% loss at the AP spine, 3.8% loss at the total right hip and 9.6% at the right femoral neck all statistically significant from her prior study 2018.  Patient is active and walks on a regular basis as well as cycles.  She takes extra vitamin D and calcium.  Is not at risk for falling and has no overt risk factors such as excess alcohol or smoking.  We reviewed her bone density study in detail and I provided her a copy for her records.  We discussed the diagnosis of osteoporosis with increased fracture risk.  We discussed treatment options with the goal to decrease fracture risk, replaced some calcium and stop ongoing calcium loss.  She apparently transiently took Fosamax and then Actonel several years ago with Dr. Cherylann Banas but had muscle and joint pain and discontinued this after a short period of time.  We discussed options to include restarting bisphosphate such as Fosamax Actonel Boniva Reclast.  Starting a different class of drugs such as Prolia Evista Evinity or Forteo.  The risks and benefits were reviewed to include GERD, osteonecrosis of the jaw, atypical fractures, or rashes, infections, thrombosis, cataracts.  My recommendation if she would like to be treated would be a retry of the Fosamax/Actonel and see how she does at this point.  If she would have side effects then consider Prolia.   The patient is not ready to start on medication at this time.  She would prefer to maximize her calcium.  Check her vitamin D level today.  Continue with weightbearing exercise and then do a short interval study next year and see how she does and then make a decision at that point.  She understands that there may be ongoing bone loss.  She also understands she could call at any time if she changes her mind and we will initiate medication.  25 minutes of face-to-face time to include review of records was spent in discussion of her osteoporosis, treatment options and ultimate decision as far as management.    Anastasio Auerbach MD, 3:27 PM 08/20/2018

## 2018-08-21 LAB — VITAMIN D 25 HYDROXY (VIT D DEFICIENCY, FRACTURES): Vit D, 25-Hydroxy: 53 ng/mL (ref 30–100)

## 2018-11-15 ENCOUNTER — Other Ambulatory Visit (HOSPITAL_COMMUNITY): Payer: Self-pay | Admitting: Internal Medicine

## 2018-11-15 DIAGNOSIS — I493 Ventricular premature depolarization: Secondary | ICD-10-CM

## 2018-11-17 ENCOUNTER — Other Ambulatory Visit: Payer: Self-pay

## 2018-11-17 ENCOUNTER — Ambulatory Visit (HOSPITAL_COMMUNITY): Payer: Medicare Other | Attending: Cardiovascular Disease

## 2018-11-17 DIAGNOSIS — I10 Essential (primary) hypertension: Secondary | ICD-10-CM | POA: Diagnosis not present

## 2018-11-17 DIAGNOSIS — I493 Ventricular premature depolarization: Secondary | ICD-10-CM

## 2018-11-17 DIAGNOSIS — E039 Hypothyroidism, unspecified: Secondary | ICD-10-CM | POA: Diagnosis not present

## 2018-11-18 ENCOUNTER — Encounter: Payer: Self-pay | Admitting: Gynecology

## 2019-01-26 ENCOUNTER — Other Ambulatory Visit: Payer: Self-pay

## 2019-01-26 DIAGNOSIS — M720 Palmar fascial fibromatosis [Dupuytren]: Secondary | ICD-10-CM | POA: Insufficient documentation

## 2019-02-01 DIAGNOSIS — M79641 Pain in right hand: Secondary | ICD-10-CM | POA: Insufficient documentation

## 2019-02-16 DIAGNOSIS — Z4789 Encounter for other orthopedic aftercare: Secondary | ICD-10-CM | POA: Insufficient documentation

## 2019-02-19 ENCOUNTER — Ambulatory Visit: Payer: Medicare Other | Attending: Internal Medicine

## 2019-02-19 DIAGNOSIS — Z23 Encounter for immunization: Secondary | ICD-10-CM

## 2019-02-19 NOTE — Progress Notes (Signed)
   Covid-19 Vaccination Clinic  Name:  April Bowers    MRN: [id number] DOB: 22-Jan-1946  02/19/2019  [name] was observed post Covid-19 immunization for 15 minutes without incidence. She was provided with Vaccine Information Sheet and instruction to access the V-Safe system.   [name] was instructed to call 911 with any severe reactions post vaccine: Marland Kitchen Difficulty breathing  . Swelling of your face and throat  . A fast heartbeat  . A bad rash all over your body  . Dizziness and weakness    Immunizations Administered    Name Date Dose VIS Date Route   Pfizer COVID-19 Vaccine 02/19/2019 11:15 AM 0.3 mL 01/21/2019 Intramuscular   Manufacturer: [employer], [employer]   Lot: [id number]   [place]: [id number]

## 2019-03-12 ENCOUNTER — Ambulatory Visit: Payer: Medicare Other | Attending: Internal Medicine

## 2019-03-12 DIAGNOSIS — Z23 Encounter for immunization: Secondary | ICD-10-CM | POA: Insufficient documentation

## 2019-03-12 NOTE — Progress Notes (Signed)
   Covid-19 Vaccination Clinic  Name:  April Bowers    MRN: [id number] DOB: May 06, 1945  03/12/2019  [name] was observed post Covid-19 immunization for 30 minutes based on pre-vaccination screening without incidence. She was provided with Vaccine Information Sheet and instruction to access the V-Safe system.   [name] was instructed to call 911 with any severe reactions post vaccine: Marland Kitchen Difficulty breathing  . Swelling of your face and throat  . A fast heartbeat  . A bad rash all over your body  . Dizziness and weakness    Immunizations Administered    Name Date Dose VIS Date Route   Pfizer COVID-19 Vaccine 03/12/2019 10:56 AM 0.3 mL 01/21/2019 Intramuscular   Manufacturer: [place]   Lot: [id number]   [place]: [id number]

## 2019-05-03 DIAGNOSIS — M25641 Stiffness of right hand, not elsewhere classified: Secondary | ICD-10-CM | POA: Insufficient documentation

## 2019-07-08 ENCOUNTER — Other Ambulatory Visit: Payer: Self-pay

## 2019-07-12 ENCOUNTER — Ambulatory Visit (INDEPENDENT_AMBULATORY_CARE_PROVIDER_SITE_OTHER): Payer: Medicare Other | Admitting: Nurse Practitioner

## 2019-07-12 ENCOUNTER — Encounter: Payer: Medicare Other | Admitting: Women's Health

## 2019-07-12 ENCOUNTER — Encounter: Payer: Self-pay | Admitting: Nurse Practitioner

## 2019-07-12 ENCOUNTER — Other Ambulatory Visit: Payer: Self-pay

## 2019-07-12 VITALS — BP 118/78 | Ht 61.5 in | Wt 149.0 lb

## 2019-07-12 DIAGNOSIS — N83202 Unspecified ovarian cyst, left side: Secondary | ICD-10-CM | POA: Diagnosis not present

## 2019-07-12 DIAGNOSIS — Z01419 Encounter for gynecological examination (general) (routine) without abnormal findings: Secondary | ICD-10-CM

## 2019-07-12 DIAGNOSIS — D251 Intramural leiomyoma of uterus: Secondary | ICD-10-CM | POA: Diagnosis not present

## 2019-07-12 DIAGNOSIS — K649 Unspecified hemorrhoids: Secondary | ICD-10-CM

## 2019-07-12 DIAGNOSIS — M810 Age-related osteoporosis without current pathological fracture: Secondary | ICD-10-CM | POA: Insufficient documentation

## 2019-07-12 MED ORDER — HYDROCORTISONE ACETATE 25 MG RE SUPP: 25.0000 mg | Freq: Two times a day (BID) | RECTAL | 1 refills | Status: DC | PRN

## 2019-07-12 NOTE — Progress Notes (Signed)
April Bowers [birth year] [id number]   History:  74 y.o. G3 P0 presents for breast and pelvic exam without GYN complaints.  Postmenopausal -no HRT, no bleeding.  Normal Pap and mammogram history.  Pelvic ultrasound 07/14/2018 showed intramural fibroid 7 x 9 mm, normal endometrium, normal right ovary, left ovary thin-walled echo-free cyst 23 x 24 x 23 mm.  CA-125 was 9 on 04/27/2017.  History of postmenopausal osteoporosis, was on Fosamax and Actonel in the past. Discussed treatment options 08/20/2018 to include restarting biphosphonate or a new class. She preferred to maximize her calcium and vitamin D and not start further treatment at that time.  PCP manages hypertension, hypothyroidism, and asthma.  Has rectal bleeding from hemorrhoids at times, uses Preparation H suppositories with some relief.   Gynecologic History No LMP recorded. Patient is postmenopausal.   Contraception: post menopausal status Last Pap: normal pap history, no longer screening Last mammogram: 04/09/2018. Results were: normal Last colonoscopy: 01/12/2014 Last Dexa: 08/12/2018. Results were: t-score -2.5   Past medical history, past surgical history, family history and social history were all reviewed and documented in the EPIC chart.  Strong family history of heart disease.  ROS:  A ROS was performed and pertinent positives and negatives are included.  Exam:  Vitals:   07/12/19 1031  Weight: 149 lb (67.6 kg)  Height: 5' 1.5" (1.562 m)   Body mass index is 27.7 kg/m.  General appearance:  Normal Thyroid:  Symmetrical, normal in size, without palpable masses or nodularity. Respiratory  Auscultation:  Clear without wheezing or rhonchi Cardiovascular  Auscultation:  Regular rate, without rubs, murmurs or gallops  Edema/varicosities:  Not grossly evident Abdominal  Soft,nontender, without masses, guarding or rebound.  Liver/spleen:  No organomegaly noted  Hernia:  None appreciated  Skin  Inspection:  Grossly  normal   Breasts: Examined lying and sitting.   Right: Without masses, retractions, discharge or axillary adenopathy.   Left: Without masses, retractions, discharge or axillary adenopathy. Gentitourinary   Inguinal/mons:  Normal without inguinal adenopathy  External genitalia:  Normal  BUS/Urethra/Skene's glands:  Normal  Vagina:  Normal, atrophic changes  Cervix:  Normal  Uterus:  Anteverted, normal in size, shape and contour.  Midline and mobile  Adnexa/parametria:     Rt: Without masses or tenderness.   Lt: Without masses or tenderness.  Anus and perineum: Normal, external hemorrhoids-nonbleeding  Digital rectal exam: Normal sphincter tone without palpated masses or tenderness  Assessment/Plan:  74 y.o.  for annual exam.   Well female exam with routine gynecological exam - Education provided on SBEs, importance of preventative screenings, current guidelines, high calcium diet, regular exercise, and multivitamin daily.   Cyst of left ovary -scheduled for pelvic ultrasound next week  Fibroids, intramural -scheduled for pelvic ultrasound next week  Age-related osteoporosis without current pathological fracture - does not want to be on medication at this time, had side effects on Fosamax and Actonel, and one other that she cannot remember the name of.  Diet consists of lots of calcium rich foods, takes 2500 units of vitamin D every other day, very active by walking 3 times a day, does spin classes 3-4 times a week, as well as weightbearing exercises.  Hemorrhoids, internal and external - continue use of preparation H suppositories as needed for relief. She is having bleeding and discomfort at times. Anusol 25 mg suppository BID as needed sent to pharmacy.   Follow-up in 1 year for annual    [place], 10:40  AM 07/12/2019

## 2019-07-12 NOTE — Patient Instructions (Addendum)
Health Maintenance After Age 76 After age 76, you are at a higher risk for certain long-term diseases and infections as well as injuries from falls. Falls are a major cause of broken bones and head injuries in people who are older than age 76. Getting regular preventive care can help to keep you healthy and well. Preventive care includes getting regular testing and making lifestyle changes as recommended by your health care provider. Talk with your health care provider about:  Which screenings and tests you should have. A screening is a test that checks for a disease when you have no symptoms.  A diet and exercise plan that is right for you. What should I know about screenings and tests to prevent falls? Screening and testing are the best ways to find a health problem early. Early diagnosis and treatment give you the best chance of managing medical conditions that are common after age 76. Certain conditions and lifestyle choices may make you more likely to have a fall. Your health care provider may recommend:  Regular vision checks. Poor vision and conditions such as cataracts can make you more likely to have a fall. If you wear glasses, make sure to get your prescription updated if your vision changes.  Medicine review. Work with your health care provider to regularly review all of the medicines you are taking, including over-the-counter medicines. Ask your health care provider about any side effects that may make you more likely to have a fall. Tell your health care provider if any medicines that you take make you feel dizzy or sleepy.  Osteoporosis screening. Osteoporosis is a condition that causes the bones to get weaker. This can make the bones weak and cause them to break more easily.  Blood pressure screening. Blood pressure changes and medicines to control blood pressure can make you feel dizzy.  Strength and balance checks. Your health care provider may recommend certain tests to check your  strength and balance while standing, walking, or changing positions.  Foot health exam. Foot pain and numbness, as well as not wearing proper footwear, can make you more likely to have a fall.  Depression screening. You may be more likely to have a fall if you have a fear of falling, feel emotionally low, or feel unable to do activities that you used to do.  Alcohol use screening. Using too much alcohol can affect your balance and may make you more likely to have a fall. What actions can I take to lower my risk of falls? General instructions  Talk with your health care provider about your risks for falling. Tell your health care provider if: ? You fall. Be sure to tell your health care provider about all falls, even ones that seem minor. ? You feel dizzy, sleepy, or off-balance.  Take over-the-counter and prescription medicines only as told by your health care provider. These include any supplements.  Eat a healthy diet and maintain a healthy weight. A healthy diet includes low-fat dairy products, low-fat (lean) meats, and fiber from whole grains, beans, and lots of fruits and vegetables. Home safety  Remove any tripping hazards, such as rugs, cords, and clutter.  Install safety equipment such as grab bars in bathrooms and safety rails on stairs.  Keep rooms and walkways well-lit. Activity   Follow a regular exercise program to stay fit. This will help you maintain your balance. Ask your health care provider what types of exercise are appropriate for you.  If you need a cane or   walker, use it as recommended by your health care provider.  Wear supportive shoes that have nonskid soles. Lifestyle  Do not drink alcohol if your health care provider tells you not to drink.  If you drink alcohol, limit how much you have: ? 0-1 drink a day for women. ? 0-2 drinks a day for men.  Be aware of how much alcohol is in your drink. In the [place] one drink equals one typical bottle of beer (12  oz), one-half glass of wine (5 oz), or one shot of hard liquor (1 oz).  Do not use any products that contain nicotine or tobacco, such as cigarettes and e-cigarettes. If you need help quitting, ask your health care provider. Summary  Having a healthy lifestyle and getting preventive care can help to protect your health and wellness after age 60.  Screening and testing are the best way to find a health problem early and help you avoid having a fall. Early diagnosis and treatment give you the best chance for managing medical conditions that are more common for people who are older than age 12.  Falls are a major cause of broken bones and head injuries in people who are older than age 80. Take precautions to prevent a fall at home.  Work with your health care provider to learn what changes you can make to improve your health and wellness and to prevent falls. This information is not intended to replace advice given to you by your health care provider. Make sure you discuss any questions you have with your health care provider. Document Revised: 05/20/2018 Document Reviewed: 12/10/2016 Elsevier Patient Education  2020 [employer].  Hemorrhoids Hemorrhoids are swollen veins that may develop:  In the butt (rectum). These are called internal hemorrhoids.  Around the opening of the butt (anus). These are called external hemorrhoids. Hemorrhoids can cause pain, itching, or bleeding. Most of the time, they do not cause serious problems. They usually get better with diet changes, lifestyle changes, and other home treatments. What are the causes? This condition may be caused by:  Having trouble pooping (constipation).  Pushing hard (straining) to poop.  Watery poop (diarrhea).  Pregnancy.  Being very overweight (obese).  Sitting for long periods of time.  Heavy lifting or other activity that causes you to strain.  Anal sex.  Riding a bike for a long period of time. What are the  signs or symptoms? Symptoms of this condition include:  Pain.  Itching or soreness in the butt.  Bleeding from the butt.  Leaking poop.  Swelling in the area.  One or more lumps around the opening of your butt. How is this diagnosed? A doctor can often diagnose this condition by looking at the affected area. The doctor may also:  Do an exam that involves feeling the area with a gloved hand (digital rectal exam).  Examine the area inside your butt using a small tube (anoscope).  Order blood tests. This may be done if you have lost a lot of blood.  Have you get a test that involves looking inside the colon using a flexible tube with a camera on the end (sigmoidoscopy or colonoscopy). How is this treated? This condition can usually be treated at home. Your doctor may tell you to change what you eat, make lifestyle changes, or try home treatments. If these do not help, procedures can be done to remove the hemorrhoids or make them smaller. These may involve:  Placing rubber bands at the base  of the hemorrhoids to cut off their blood supply.  Injecting medicine into the hemorrhoids to shrink them.  Shining a type of light energy onto the hemorrhoids to cause them to fall off.  Doing surgery to remove the hemorrhoids or cut off their blood supply. Follow these instructions at home: Eating and drinking   Eat foods that have a lot of fiber in them. These include whole grains, beans, nuts, fruits, and vegetables.  Ask your doctor about taking products that have added fiber (fibersupplements).  Reduce the amount of fat in your diet. You can do this by: ? Eating low-fat dairy products. ? Eating less red meat. ? Avoiding processed foods.  Drink enough fluid to keep your pee (urine) pale yellow. Managing pain and swelling   Take a warm-water bath (sitz bath) for 20 minutes to ease pain. Do this 3-4 times a day. You may do this in a bathtub or using a portable sitz bath that fits  over the toilet.  If told, put ice on the painful area. It may be helpful to use ice between your warm baths. ? Put ice in a plastic bag. ? Place a towel between your skin and the bag. ? Leave the ice on for 20 minutes, 2-3 times a day. General instructions  Take over-the-counter and prescription medicines only as told by your doctor. ? Medicated creams and medicines may be used as told.  Exercise often. Ask your doctor how much and what kind of exercise is best for you.  Go to the bathroom when you have the urge to poop. Do not wait.  Avoid pushing too hard when you poop.  Keep your butt dry and clean. Use wet toilet paper or moist towelettes after pooping.  Do not sit on the toilet for a long time.  Keep all follow-up visits as told by your doctor. This is important. Contact a doctor if you:  Have pain and swelling that do not get better with treatment or medicine.  Have trouble pooping.  Cannot poop.  Have pain or swelling outside the area of the hemorrhoids. Get help right away if you have:  Bleeding that will not stop. Summary  Hemorrhoids are swollen veins in the butt or around the opening of the butt.  They can cause pain, itching, or bleeding.  Eat foods that have a lot of fiber in them. These include whole grains, beans, nuts, fruits, and vegetables.  Take a warm-water bath (sitz bath) for 20 minutes to ease pain. Do this 3-4 times a day. This information is not intended to replace advice given to you by your health care provider. Make sure you discuss any questions you have with your health care provider. Document Revised: 02/04/2018 Document Reviewed: 06/18/2017 Elsevier Patient Education  [place].

## 2019-07-12 NOTE — Addendum Note (Signed)
Addended byMarny Lowenstein on: 07/12/2019 04:08 PM   Modules accepted: Level of Service

## 2019-07-15 ENCOUNTER — Other Ambulatory Visit: Payer: Self-pay

## 2019-07-19 ENCOUNTER — Encounter: Payer: Self-pay | Admitting: Nurse Practitioner

## 2019-07-19 ENCOUNTER — Other Ambulatory Visit: Payer: Self-pay | Admitting: Nurse Practitioner

## 2019-07-19 ENCOUNTER — Other Ambulatory Visit: Payer: Medicare Other

## 2019-07-19 ENCOUNTER — Ambulatory Visit (INDEPENDENT_AMBULATORY_CARE_PROVIDER_SITE_OTHER): Payer: Medicare Other | Admitting: Nurse Practitioner

## 2019-07-19 ENCOUNTER — Other Ambulatory Visit: Payer: Self-pay

## 2019-07-19 ENCOUNTER — Ambulatory Visit (INDEPENDENT_AMBULATORY_CARE_PROVIDER_SITE_OTHER): Payer: Medicare Other

## 2019-07-19 VITALS — BP 130/80

## 2019-07-19 DIAGNOSIS — D251 Intramural leiomyoma of uterus: Secondary | ICD-10-CM

## 2019-07-19 DIAGNOSIS — N83202 Unspecified ovarian cyst, left side: Secondary | ICD-10-CM

## 2019-07-19 NOTE — Progress Notes (Signed)
History: 74 year old here to discuss ultrasound.  History of persistent intramural fibroids and left ovarian cyst with low Ca-125.  Ultrasound 07/14/2018 showed intramural fibroid 7 x 9 mm.  Left ovary thin-walled echo-free cyst 23 x 24 x 23 mm.  CA-125 was 9 on 04/27/2017.    Exam: Appears well Ultrasound: Small anteverted uterus, normal size and shape, no change in small intramural fibroid.  Thin, symmetrical endometrium 1.3 mm, no masses or thickening seen.  Right ovary small -atrophic changes.  Left ovary with simple avascular cystic structure 23 x 18 mm, slightly smaller than previous scan.  No other adnexal masses, no free fluid.  Assessment: Intramural fibroid Left ovarian cyst  Plan: Ultrasound reviewed and reassurance provided on slightly smaller ovarian cyst.  She would like to continue annual ultrasounds.  Follow-up as needed.

## 2019-11-18 ENCOUNTER — Other Ambulatory Visit: Payer: Self-pay | Admitting: Internal Medicine

## 2019-11-18 DIAGNOSIS — E7800 Pure hypercholesterolemia, unspecified: Secondary | ICD-10-CM

## 2019-12-06 ENCOUNTER — Ambulatory Visit
Admission: RE | Admit: 2019-12-06 | Discharge: 2019-12-06 | Disposition: A | Discharge status: Home / Self Care | Payer: Self-pay | Source: Ambulatory Visit | Attending: Internal Medicine | Admitting: Internal Medicine

## 2019-12-06 DIAGNOSIS — E7800 Pure hypercholesterolemia, unspecified: Secondary | ICD-10-CM

## 2019-12-19 ENCOUNTER — Ambulatory Visit (INDEPENDENT_AMBULATORY_CARE_PROVIDER_SITE_OTHER): Payer: Medicare Other | Admitting: Internal Medicine

## 2019-12-19 ENCOUNTER — Encounter: Payer: Self-pay | Admitting: Internal Medicine

## 2019-12-19 VITALS — BP 142/68 | HR 63 | Ht 62.0 in | Wt 149.8 lb

## 2019-12-19 DIAGNOSIS — I10 Essential (primary) hypertension: Secondary | ICD-10-CM

## 2019-12-19 DIAGNOSIS — I2510 Atherosclerotic heart disease of native coronary artery without angina pectoris: Secondary | ICD-10-CM

## 2019-12-19 DIAGNOSIS — I2584 Coronary atherosclerosis due to calcified coronary lesion: Secondary | ICD-10-CM

## 2019-12-19 DIAGNOSIS — E785 Hyperlipidemia, unspecified: Secondary | ICD-10-CM | POA: Diagnosis not present

## 2019-12-19 DIAGNOSIS — E890 Postprocedural hypothyroidism: Secondary | ICD-10-CM | POA: Diagnosis not present

## 2019-12-19 NOTE — Progress Notes (Signed)
Cardiology Office Note:    Date:  12/19/2019   ID:  ROBBIN LOUGHMILLER, DOB [birth year], MRN [id number]  PCP:  Lavone Orn, MD  Cardiologist:  No primary care provider on file.  Electrophysiologist:  None   Referring MD: Lavone Orn, MD   Chief Complaint/Reason for Referral: Coronary artery disease as noted by elevated coronary artery calcium score  History of Present Illness:    April Bowers is a 74 y.o. female with a history of HTN, HLD, strong family history of CAD, hyperthyroidism treated with radioactive iodine now hypothyroidism, mild intermittent asthma, GERD who presents for evaluation after having a coronary calcium score of 132, 63rd percentile.  She has since started a baby aspirin. She was previously taking atorvastatin but had myalgias which she feels are better since stopping.   She is overall asymptomatic. She exercises several days a week. She denies chest pain, chest pressure, dyspnea at rest or with exertion, palpitations, PND, orthopnea, or leg swelling. Denies cough, fever, chills. Denies nausea, vomiting. Denies syncope or presyncope. Denies dizziness or lightheadedness. Denies snoring.  Strong family history of CAD in mother, brother and sister.   Past Medical History:  Diagnosis Date  . Complication of anesthesia    took much longer to awaken from anesthesia-45 yrs ago,last colonoscopy not a problem few yrs ago  . Diverticulosis   . Elevated cholesterol   . Glaucoma    bilateral  . Hypertension   . Hypothyroidism   . Osteoporosis 08/2018   T score -2.5    Past Surgical History:  Procedure Laterality Date  . COLONOSCOPY WITH PROPOFOL N/A 03/13/2014   Procedure: COLONOSCOPY WITH PROPOFOL;  Surgeon: Garlan Fair, MD;  Location: [facility];  Service: Endoscopy;  Laterality: N/A;  . DILATION AND CURETTAGE OF UTERUS     MISSED AB  . HAND SURGERY    . KNEE SURGERY Left    left knee scope    Current Medications: Current Meds  Medication Sig  .  amLODipine (NORVASC) 10 MG tablet Take 10 mg by mouth at bedtime.  Marland Kitchen aspirin EC 81 MG tablet Take 81 mg by mouth daily. Swallow whole.  Marland Kitchen BIOTIN PO Take 1 tablet by mouth daily.  . Cetirizine HCl (ZYRTEC ALLERGY PO) Take by mouth daily.  . Cholecalciferol (VITAMIN D3) 2000 units TABS Take by mouth. Pt takes 2500 units every other day.  . Cyanocobalamin (VITAMIN B 12) 100 MCG LOZG Take by mouth daily.   . Glucosamine HCl-Glucosamin SO4 (GLUCOSAMINE COMPLEX PO) Take 1 tablet by mouth daily.   Marland Kitchen latanoprost (XALATAN) 0.005 % ophthalmic solution Place 1 drop into both eyes at bedtime.  Marland Kitchen levothyroxine (SYNTHROID) 75 MCG tablet Take 1 tablet by mouth daily. Pt takes 75 mcg every third day  . levothyroxine (SYNTHROID, LEVOTHROID) 88 MCG tablet Take 88 mcg by mouth. Pt takes 88 mcg as directed  . Multiple Vitamin (MULTIVITAMIN WITH MINERALS) TABS tablet Take 1 tablet by mouth daily.  . Pyridoxine HCl (VITAMIN B6) 100 MG TABS Take by mouth daily.  . vitamin C (ASCORBIC ACID) 250 MG tablet Take 500 mg by mouth daily.     Allergies:   Sulfa antibiotics and Penicillins   Social History   Tobacco Use  . Smoking status: Former Smoker    Quit date: 03/01/1970    Years since quitting: 49.8  . Smokeless tobacco: Never Used  Vaping Use  . Vaping Use: Never used  Substance Use Topics  . Alcohol use: Yes  Alcohol/week: 0.0 standard drinks    Comment: rarely 1-2 month  . Drug use: No     Family History: The patient's family history includes Diabetes in her maternal grandmother; Heart disease in her brother, mother, sister, and sister; Hypertension in her father, mother, and sister.  ROS:   Please see the history of present illness.    All other systems reviewed and are negative.  EKGs/Labs/Other Studies Reviewed:    The following studies were reviewed today:  EKG: NSR rate 63  Recent Labs: No results found for requested labs within last 8760 hours.  Recent Lipid Panel No results found  for: CHOL, TRIG, HDL, CHOLHDL, VLDL, LDLCALC, LDLDIRECT  Physical Exam:    VS:  BP (!) 142/68 (BP Location: Left Arm, Patient Position: Sitting)   Pulse 63   Ht 5\' 2"  (1.575 m)   Wt 149 lb 12.8 oz (67.9 kg)   SpO2 98%   BMI 27.40 kg/m     Wt Readings from Last 5 Encounters:  07/12/19 149 lb (67.6 kg)  06/23/18 142 lb (64.4 kg)  04/27/17 154 lb (69.9 kg)  04/23/16 158 lb 3.2 oz (71.8 kg)  04/20/15 160 lb (72.6 kg)    Constitutional: No acute distress Eyes: sclera non-icteric, normal conjunctiva and lids ENMT: normal dentition, moist mucous membranes Cardiovascular: regular rhythm, normal rate, no murmurs. S1 and S2 normal. Radial pulses normal bilaterally. No jugular venous distention.  Respiratory: clear to auscultation bilaterally GI : normal bowel sounds, soft and nontender. No distention.   MSK: extremities warm, well perfused. No edema.  NEURO: grossly nonfocal exam, moves all extremities. PSYCH: alert and oriented x 3, normal mood and affect.   ASSESSMENT:    1. Coronary artery calcification   2. Essential hypertension, benign   3. Hyperlipidemia, unspecified hyperlipidemia type   4. Postablative hypothyroidism    PLAN:    CAD with coronary artery calcifications  HLD   -Asymptomatic with moderately elevated coronary artery calcium score. She has myalgias with low dose statin, would recommend consideration of PCSK9I, she will consider. - continue ASA 81 mg daily. - continue moderate aerobic exercise as noted below for best CV health  HTN - continue on amlodipine 10 mg daily   CV risk reduction - she would like to continue diet and lifestyle modification for best CV health. Discussed as below:  Exercise recommendations: Goal of exercising for at least 30 minutes a day, at least 5 times per week.  Please exercise to a moderate exertion.  This means that while exercising it is difficult to speak in full sentences, however you are not so short of breath that you feel  you must stop, and not so comfortable that you can carry on a full conversation.  Exertion level should be approximately a 5/10, if 10 is the most exertion you can perform.  Diet recommendations: Recommend a heart healthy diet such as the Mediterranean diet.  This diet consists of plant based foods, healthy fats, lean meats, olive oil.  It suggests limiting the intake of simple carbohydrates such as white breads, pastries, and pastas.  It also limits the amount of red meat, wine, and dairy products such as cheese that one should consume on a daily basis.   Total time of encounter: 61 minutes total time of encounter, including 35 minutes spent in face-to-face patient care on the date of this encounter. This time includes coordination of care and counseling regarding above mentioned problem list. Remainder of non-face-to-face time involved reviewing chart  documents/testing relevant to the patient encounter and documentation in the medical record. I have independently reviewed documentation from referring provider.   Cherlynn Kaiser, MD [place]  [facility]    Medication Adjustments/Labs and Tests Ordered: Current medicines are reviewed at length with the patient today.  Concerns regarding medicines are outlined above.   Orders Placed This Encounter  Procedures  . EKG 12-Lead    No orders of the defined types were placed in this encounter.   Patient Instructions  Medication Instructions:  No Changes In Medications at this time.  *If you need a refill on your cardiac medications before your next appointment, please call your pharmacy*  Lab Work: None Ordered At This Time.  If you have labs (blood work) drawn today and your tests are completely normal, you will receive your results only by: Marland Kitchen MyChart Message (if you have MyChart) OR . A paper copy in the mail If you have any lab test that is abnormal or we need to change your treatment, we will call you to review the  results.  Testing/Procedures: None Ordered At This Time.   Follow-Up: At [facility], you and your health needs are our priority.  As part of our continuing mission to provide you with exceptional heart care, we have created designated Provider Care Teams.  These Care Teams include your primary Cardiologist (physician) and Advanced Practice Providers (APPs -  Physician Assistants and Nurse Practitioners) who all work together to provide you with the care you need, when you need it.  Your next appointment:   6 month(s)  The format for your next appointment:   In Person  Provider:   Cherlynn Kaiser, MD

## 2019-12-19 NOTE — Patient Instructions (Signed)
Medication Instructions:  No Changes In Medications at this time.  *If you need a refill on your cardiac medications before your next appointment, please call your pharmacy*  Lab Work: None Ordered At This Time.  If you have labs (blood work) drawn today and your tests are completely normal, you will receive your results only by: . MyChart Message (if you have MyChart) OR . A paper copy in the mail If you have any lab test that is abnormal or we need to change your treatment, we will call you to review the results.  Testing/Procedures: None Ordered At This Time.   Follow-Up: At [facility], you and your health needs are our priority.  As part of our continuing mission to provide you with exceptional heart care, we have created designated Provider Care Teams.  These Care Teams include your primary Cardiologist (physician) and Advanced Practice Providers (APPs -  Physician Assistants and Nurse Practitioners) who all work together to provide you with the care you need, when you need it.  Your next appointment:   6 month(s)  The format for your next appointment:   In Person  Provider:   Gayatri Acharya, MD   

## 2020-06-19 ENCOUNTER — Other Ambulatory Visit: Payer: Self-pay

## 2020-06-19 ENCOUNTER — Encounter: Payer: Self-pay | Admitting: Internal Medicine

## 2020-06-19 ENCOUNTER — Ambulatory Visit (INDEPENDENT_AMBULATORY_CARE_PROVIDER_SITE_OTHER): Payer: Medicare Other | Admitting: Internal Medicine

## 2020-06-19 VITALS — BP 128/70 | HR 63 | Ht 62.0 in | Wt 153.8 lb

## 2020-06-19 DIAGNOSIS — I10 Essential (primary) hypertension: Secondary | ICD-10-CM | POA: Diagnosis not present

## 2020-06-19 DIAGNOSIS — E785 Hyperlipidemia, unspecified: Secondary | ICD-10-CM | POA: Diagnosis not present

## 2020-06-19 NOTE — Progress Notes (Signed)
Cardiology Office Note:    Date:  06/19/2020   ID:  MATILDA FLEIG, DOB April 28, 1945, MRN [id number]  PCP:  Lavone Orn, MD  Cardiologist:  None  Electrophysiologist:  None   Referring MD: Lavone Orn, MD   Chief Complaint/Reason for Referral: Coronary artery disease as noted by elevated coronary artery calcium score  History of Present Illness:    April Bowers is a 75 y.o. female with a history of HTN, HLD, strong family history of CAD, hyperthyroidism treated with radioactive iodine now hypothyroidism, mild intermittent asthma, GERD who presents for evaluation after having a coronary calcium score of 132, 63rd percentile.  She has since started a baby aspirin. She was previously taking atorvastatin but had myalgias and was transition to Crestor 5 mg every other day.  Continues to have some myalgias and we discussed PCSK9 inhibitor therapy.  Today, she is feeling great overall. She is still experiencing some minor muscle aches mostly around her gluteus muscles and calves. She notes not having any blood-work since she started taking Crestor every other day (about 4 months). However, she is not fasting at this time and will return another day for labs.   She denies any chest pain, shortness of breath, or palpitations. No pre-syncope, syncope, or lightheadedness/dizziness to note. Also has no lower extremity edema, orthopnea or PND.  We discussed the results and implications of her most recent cardiac calcium score. She is still taking baby aspirin but asks if this needs to continue. I recommended she continue with the aspirin based on her calcium score, which is a class II recommendation in the guidelines.  She would like to continue taking aspirin and I agree.  Walking is her main formal exercise. She enjoys completing crossword puzzles in her free time. She will also donate blood a couple times a year.  Past Medical History:  Diagnosis Date  . Complication of anesthesia    took much  longer to awaken from anesthesia-45 yrs ago,last colonoscopy not a problem few yrs ago  . Diverticulosis   . Elevated cholesterol   . Glaucoma    bilateral  . Hypertension   . Hypothyroidism   . Osteoporosis 08/2018   T score -2.5    Past Surgical History:  Procedure Laterality Date  . COLONOSCOPY WITH PROPOFOL N/A 03/13/2014   Procedure: COLONOSCOPY WITH PROPOFOL;  Surgeon: Garlan Fair, MD;  Location: [facility];  Service: Endoscopy;  Laterality: N/A;  . DILATION AND CURETTAGE OF UTERUS     MISSED AB  . HAND SURGERY    . KNEE SURGERY Left    left knee scope    Current Medications: Current Meds  Medication Sig  . albuterol (VENTOLIN HFA) 108 (90 Base) MCG/ACT inhaler 2 puffs as needed  . amLODipine (NORVASC) 10 MG tablet Take 10 mg by mouth at bedtime.  Marland Kitchen aspirin EC 81 MG tablet Take 81 mg by mouth daily. Swallow whole.  Marland Kitchen BIOTIN PO Take 1 tablet by mouth daily.  . Cetirizine HCl (ZYRTEC ALLERGY PO) Take by mouth daily.  . Cholecalciferol (VITAMIN D) 125 MCG (5000 UT) CAPS 1 capsule  . Cholecalciferol (VITAMIN D3) 2000 units TABS Take by mouth. Pt takes 2500 units every other day.  . Cyanocobalamin (VITAMIN B 12) 100 MCG LOZG Take by mouth daily.   . Glucosamine HCl-Glucosamin SO4 (GLUCOSAMINE COMPLEX PO) Take 1 tablet by mouth daily.   Marland Kitchen glucosamine-chondroitin 500-400 MG tablet daily.  Marland Kitchen latanoprost (XALATAN) 0.005 % ophthalmic solution Place 1 drop  into both eyes at bedtime.  Marland Kitchen latanoprost (XALATAN) 0.005 % ophthalmic solution latanoprost 0.005 % eye drops  INSTILL 1 DROP INTO EACH EYE AT BEDTIME  . levothyroxine (SYNTHROID) 75 MCG tablet Take 1 tablet by mouth daily. Pt takes 75 mcg every third day  . levothyroxine (SYNTHROID, LEVOTHROID) 88 MCG tablet Take 88 mcg by mouth. Pt takes 88 mcg as directed  . Multiple Vitamin (MULTIVITAMIN WITH MINERALS) TABS tablet Take 1 tablet by mouth daily.  . Pyridoxine HCl (VITAMIN B6) 100 MG TABS Take by mouth daily.  .  rosuvastatin (CRESTOR) 5 MG tablet 1 tablet  . vitamin C (ASCORBIC ACID) 250 MG tablet Take 500 mg by mouth daily.     Allergies:   Sulfa antibiotics and Penicillins   Social History   Tobacco Use  . Smoking status: Former Smoker    Quit date: 03/01/1970    Years since quitting: 50.3  . Smokeless tobacco: Never Used  Vaping Use  . Vaping Use: Never used  Substance Use Topics  . Alcohol use: Yes    Alcohol/week: 0.0 standard drinks    Comment: rarely 1-2 month  . Drug use: No     Family History: The patient's family history includes Diabetes in her maternal grandmother; Heart disease in her brother, mother, sister, and sister; Hypertension in her father, mother, and sister.  ROS:   Please see the history of present illness.    (+) Minor myalgia All other systems reviewed and are negative.  EKGs/Labs/Other Studies Reviewed:    The following studies were reviewed today:  CT Cardiac Score 12/06/2019: IMPRESSION: 1. Coronary calcium score is 132 and this is at percentile 63 for patients of the same age, gender and ethnicity. 2. 3 mm nodular density in the left lower lobe is nonspecific. No follow-up needed if patient is low-risk. Non-contrast chest CT can be considered in 12 months if patient is high-risk. This recommendation follows the consensus statement: Guidelines for Management of Incidental Pulmonary Nodules Detected on CT Images: From the Fleischner Society 2017; Radiology 2017; 284:228-243.  Echo 11/17/2018: 1. Left ventricular ejection fraction, by visual estimation, is 55 to 60%. The left ventricle has normal function. There is no left ventricular hypertrophy. 2. Left ventricular diastolic Doppler parameters are consistent with impaired relaxation pattern of LV diastolic filling. 3. Global right ventricle has normal systolic function.The right ventricular size is normal. No increase in right ventricular wall thickness. 4. Left atrial size was normal. 5. Right  atrial size was normal. 6. The mitral valve is normal in structure. Trace mitral valve regurgitation. 7. The tricuspid valve is normal in structure. Tricuspid valve regurgitation is trivial. 8. The aortic valve is normal in structure. Aortic valve regurgitation is trivial by color flow Doppler. 9. The pulmonic valve was normal in structure. Pulmonic valve regurgitation is not visualized by color flow Doppler. 10. The atrial septum is grossly normal.  EKG:  06/19/2020: NSR, rate 63 bpm 12/19/2019: NSR rate 63  Recent Labs: No results found for requested labs within last 8760 hours.  Recent Lipid Panel No results found for: CHOL, TRIG, HDL, CHOLHDL, VLDL, LDLCALC, LDLDIRECT  Physical Exam:    VS:  BP 128/70   Pulse 63   Ht 5\' 2"  (1.575 m)   Wt 153 lb 12.8 oz (69.8 kg)   SpO2 98%   BMI 28.13 kg/m     Wt Readings from Last 5 Encounters:  06/19/20 153 lb 12.8 oz (69.8 kg)  12/19/19 149 lb 12.8 oz (67.9  kg)  07/12/19 149 lb (67.6 kg)  06/23/18 142 lb (64.4 kg)  04/27/17 154 lb (69.9 kg)    Constitutional: No acute distress Eyes: sclera non-icteric, normal conjunctiva and lids ENMT: normal dentition, moist mucous membranes Cardiovascular: regular rhythm, normal rate, no murmurs. S1 and S2 normal. Radial pulses normal bilaterally. No jugular venous distention.  Respiratory: clear to auscultation bilaterally GI : normal bowel sounds, soft and nontender. No distention.   MSK: extremities warm, well perfused. No edema.  NEURO: grossly nonfocal exam, moves all extremities. PSYCH: alert and oriented x 3, normal mood and affect.   ASSESSMENT:    1. Essential hypertension, benign   2. Hyperlipidemia, unspecified hyperlipidemia type    PLAN:    CAD with coronary artery calcifications  HLD   -Asymptomatic with moderately elevated coronary artery calcium score. She has myalgias with low dose statin, would recommend consideration of PCSK9I, will refer to [facility]. - continue ASA 81 mg  daily. - continue moderate aerobic exercise as noted below for best CV health  HTN - continue on amlodipine 10 mg daily   CV risk reduction - she would like to continue diet and lifestyle modification for best CV health. Discussed as below:  Exercise recommendations: Goal of exercising for at least 30 minutes a day, at least 5 times per week.  Please exercise to a moderate exertion.  This means that while exercising it is difficult to speak in full sentences, however you are not so short of breath that you feel you must stop, and not so comfortable that you can carry on a full conversation.  Exertion level should be approximately a 5/10, if 10 is the most exertion you can perform.  Diet recommendations: Recommend a heart healthy diet such as the Mediterranean diet.  This diet consists of plant based foods, healthy fats, lean meats, olive oil.  It suggests limiting the intake of simple carbohydrates such as white breads, pastries, and pastas.  It also limits the amount of red meat, wine, and dairy products such as cheese that one should consume on a daily basis.   Total time of encounter: 30 minutes total time of encounter, including 20 minutes spent in face-to-face patient care on the date of this encounter. This time includes coordination of care and counseling regarding above mentioned problem list. Remainder of non-face-to-face time involved reviewing chart documents/testing relevant to the patient encounter and documentation in the medical record. I have independently reviewed documentation from referring provider.   Cherlynn Kaiser, MD [place]  [facility]    Medication Adjustments/Labs and Tests Ordered: Current medicines are reviewed at length with the patient today.  Concerns regarding medicines are outlined above.   Orders Placed This Encounter  Procedures  . Lipid panel  . Hepatic function panel  . EKG 12-Lead    No orders of the defined types were placed in this  encounter.   Patient Instructions  Medication Instructions:  No Changes In Medications at this time.  *If you need a refill on your cardiac medications before your next appointment, please call your pharmacy*  Lab Work: LIPIDS/HEPATIC FUNCTION- PLEASE RETURN FOR THIS AFTER FASTING If you have labs (blood work) drawn today and your tests are completely normal, you will receive your results only by: Marland Kitchen MyChart Message (if you have MyChart) OR . A paper copy in the mail If you have any lab test that is abnormal or we need to change your treatment, we will call you to review the results.  Follow-Up: At [facility], you and your health needs are our priority.  As part of our continuing mission to provide you with exceptional heart care, we have created designated Provider Care Teams.  These Care Teams include your primary Cardiologist (physician) and Advanced Practice Providers (APPs -  Physician Assistants and Nurse Practitioners) who all work together to provide you with the care you need, when you need it.   Your next appointment:   6 month(s)  The format for your next appointment:   In Person  Provider:   Cherlynn Kaiser, MD  Other Instructions [facility] [place]      I,Mathew Stumpf,acting as a scribe for Elouise Munroe, MD.,have documented all relevant documentation on the behalf of Elouise Munroe, MD,as directed by  Elouise Munroe, MD while in the presence of Elouise Munroe, MD.  I, Elouise Munroe, MD, have reviewed all documentation for this visit. The documentation on 06/19/20 for the exam, diagnosis, procedures, and orders are all accurate and complete.

## 2020-06-19 NOTE — Patient Instructions (Signed)
Medication Instructions:  No Changes In Medications at this time.  *If you need a refill on your cardiac medications before your next appointment, please call your pharmacy*  Lab Work: LIPIDS/HEPATIC FUNCTION- PLEASE RETURN FOR THIS AFTER FASTING If you have labs (blood work) drawn today and your tests are completely normal, you will receive your results only by: Marland Kitchen MyChart Message (if you have MyChart) OR . A paper copy in the mail If you have any lab test that is abnormal or we need to change your treatment, we will call you to review the results.  Follow-Up: At [facility], you and your health needs are our priority.  As part of our continuing mission to provide you with exceptional heart care, we have created designated Provider Care Teams.  These Care Teams include your primary Cardiologist (physician) and Advanced Practice Providers (APPs -  Physician Assistants and Nurse Practitioners) who all work together to provide you with the care you need, when you need it.   Your next appointment:   6 month(s)  The format for your next appointment:   In Person  Provider:   Cherlynn Kaiser, MD  Other Instructions [facility]- [place]

## 2020-06-21 LAB — HEPATIC FUNCTION PANEL
ALT: 22 IU/L (ref 0–32)
AST: 24 IU/L (ref 0–40)
Albumin: 4.2 g/dL (ref 3.7–4.7)
Alkaline Phosphatase: 82 IU/L (ref 44–121)
Bilirubin Total: <0.2 mg/dL (ref 0.0–1.2)
Bilirubin, Direct: <0.1 mg/dL (ref 0.00–0.40)
Total Protein: 6.3 g/dL (ref 6.0–8.5)

## 2020-06-21 LAB — LIPID PANEL
Chol/HDL Ratio: 2.1 ratio (ref 0.0–4.4)
Cholesterol, Total: 200 mg/dL — ABNORMAL HIGH (ref 100–199)
HDL: 94 mg/dL (ref >39)
LDL Chol Calc (NIH): 96 mg/dL (ref 0–99)
Triglycerides: 52 mg/dL (ref 0–149)
VLDL Cholesterol Cal: 10 mg/dL (ref 5–40)

## 2020-07-12 ENCOUNTER — Other Ambulatory Visit: Payer: Self-pay

## 2020-07-12 ENCOUNTER — Ambulatory Visit (INDEPENDENT_AMBULATORY_CARE_PROVIDER_SITE_OTHER): Payer: Medicare Other | Admitting: Pharmacist

## 2020-07-12 DIAGNOSIS — E7800 Pure hypercholesterolemia, unspecified: Secondary | ICD-10-CM

## 2020-07-12 NOTE — Patient Instructions (Addendum)
Your Results:             Your most recent labs Goal  Total Cholesterol 200 < 200  Triglycerides 52 < 150  HDL (good cholesterol) 94 > 40  LDL (bad cholesterol) 96 < 70     Medication changes: *PLAN TO START PRIOR AUTHORIZATION FOR REPATHA/PRALUENT EVERY 14 DAYS*  Lab orders: PLAN TO REPEAT FASTING BLOOD WORK AFTER 4 DOSES OF [facility] phone number: [phone] Sanjay Broadfoot/KRISTIN/HALEIGH  Thank you for choosing [facility]

## 2020-07-12 NOTE — Progress Notes (Signed)
Patient ID: DORETTA REMMERT                 DOB: 1945-11-28                    MRN: [id number]     HPI: April Bowers is a 75 y.o. female patient referred to lipid clinic by Dr. Margaretann Loveless  PMH is significant for hypertension, hyperlipidemia, family history of CAD, hyperthyroidism, asthma, glaucoma GERD, and calcium score of 132 (LAD 111) with 2 percentile for gender, age and ethnicity.   Current Medications:  Rosuvastatin 5mg  every other day - also taking coQ 10 daily  Intolerance: Atorvastatin 10mg  daily- myalgias (1 year) Rosuvastatin 5mg  - myalgias  (tried daily and every other day)   LDL goal: < 70mg /dL  Diet: avoid red meats, avoid fried foods, eat plenty of vegetables, and decreases sugars in diet  Exercise: spinning class 3x/week, walking  Family History: family history includes Diabetes in her maternal grandmother; Heart disease in her quadruple CABG brother, mother, sister, and another sister died of massive MI; Hypertension in her father, mother, and sister.  Social History: former smoker, occassional alcohol  Labs: 06/21/2020: CHO 200, TG 52, HDL 94, LDL-c 96 on rosuvastatin  Past Medical History:  Diagnosis Date  . Complication of anesthesia    took much longer to awaken from anesthesia-45 yrs ago,last colonoscopy not a problem few yrs ago  . Diverticulosis   . Elevated cholesterol   . Glaucoma    bilateral  . Hypertension   . Hypothyroidism   . Osteoporosis 08/2018   T score -2.5    Current Outpatient Medications on File Prior to Visit  Medication Sig Dispense Refill  . albuterol (VENTOLIN HFA) 108 (90 Base) MCG/ACT inhaler 2 puffs as needed    . amLODipine (NORVASC) 10 MG tablet Take 10 mg by mouth at bedtime.    Marland Kitchen aspirin EC 81 MG tablet Take 81 mg by mouth daily. Swallow whole.    Marland Kitchen BIOTIN PO Take 1 tablet by mouth daily.    . Cetirizine HCl (ZYRTEC ALLERGY PO) Take by mouth daily.    . Cholecalciferol (VITAMIN D) 125 MCG (5000 UT) CAPS 1 capsule    .  Cyanocobalamin (VITAMIN B 12) 100 MCG LOZG Take by mouth daily.     Marland Kitchen glucosamine-chondroitin 500-400 MG tablet daily.    Marland Kitchen latanoprost (XALATAN) 0.005 % ophthalmic solution Place 1 drop into both eyes at bedtime.    Marland Kitchen levothyroxine (SYNTHROID) 75 MCG tablet Take 1 tablet by mouth daily. Pt takes 75 mcg every third day    . levothyroxine (SYNTHROID, LEVOTHROID) 88 MCG tablet Take 88 mcg by mouth. Pt takes 88 mcg as directed    . Multiple Vitamin (MULTIVITAMIN WITH MINERALS) TABS tablet Take 1 tablet by mouth daily.    . Pyridoxine HCl (VITAMIN B6) 100 MG TABS Take by mouth daily.    . rosuvastatin (CRESTOR) 5 MG tablet 1 tablet     No current facility-administered medications on file prior to visit.    Allergies  Allergen Reactions  . Sulfa Antibiotics Anaphylaxis  . Atorvastatin     Other reaction(s): myalgias  . Crestor [Rosuvastatin]     myalgias  . Methimazole     Other reaction(s): rash  . Penicillins Hives    Hypercholesteremia LDL remains above goal for secondary prevention. Noted patient is unable to tolerate daily dosing of atorvastatin or rosuvastatin. Currently taking rosuvastatin every other day, but continues to experience  muscle aches. LDL remains above goal while on maximum tolerated statin. Patient follows a low fat diet, and exercises frequently.  We discussed PCSK9i therapy (repatha/praluent) including: MOA, storage, administration, common side effects, monitoring, and prior-authorization process.  Patient agrees on PCSK9i therapy. Will initiated process for Repatha Sureclick 140mg  and follow up as needed. Plan to repeat fasting blood work after 4 doses of new medication.   April Bowers PharmD, BCPS, April Bowers [id number] 07/12/2020 4:33 PM

## 2020-07-12 NOTE — Assessment & Plan Note (Signed)
LDL remains above goal for secondary prevention. Noted patient is unable to tolerate daily dosing of atorvastatin or rosuvastatin. Currently taking rosuvastatin every other day, but continues to experience muscle aches. LDL remains above goal while on maximum tolerated statin. Patient follows a low fat diet, and exercises frequently.  We discussed PCSK9i therapy (repatha/praluent) including: MOA, storage, administration, common side effects, monitoring, and prior-authorization process.  Patient agrees on PCSK9i therapy. Will initiated process for Repatha Sureclick 140mg  and follow up as needed. Plan to repeat fasting blood work after 4 doses of new medication.

## 2020-07-16 ENCOUNTER — Ambulatory Visit (INDEPENDENT_AMBULATORY_CARE_PROVIDER_SITE_OTHER): Payer: Medicare Other | Admitting: Nurse Practitioner

## 2020-07-16 ENCOUNTER — Other Ambulatory Visit: Payer: Self-pay

## 2020-07-16 ENCOUNTER — Encounter: Payer: Self-pay | Admitting: Nurse Practitioner

## 2020-07-16 VITALS — BP 120/78 | Ht 61.0 in | Wt 154.0 lb

## 2020-07-16 DIAGNOSIS — Z01419 Encounter for gynecological examination (general) (routine) without abnormal findings: Secondary | ICD-10-CM | POA: Diagnosis not present

## 2020-07-16 DIAGNOSIS — N83202 Unspecified ovarian cyst, left side: Secondary | ICD-10-CM | POA: Diagnosis not present

## 2020-07-16 DIAGNOSIS — M810 Age-related osteoporosis without current pathological fracture: Secondary | ICD-10-CM

## 2020-07-16 DIAGNOSIS — Z780 Asymptomatic menopausal state: Secondary | ICD-10-CM

## 2020-07-16 NOTE — Patient Instructions (Signed)
Health Maintenance After Age 77 After age 77, you are at a higher risk for certain long-term diseases and infections as well as injuries from falls. Falls are a major cause of broken bones and head injuries in people who are older than age 77. Getting regular preventive care can help to keep you healthy and well. Preventive care includes getting regular testing and making lifestyle changes as recommended by your health care provider. Talk with your health care provider about:  Which screenings and tests you should have. A screening is a test that checks for a disease when you have no symptoms.  A diet and exercise plan that is right for you. What should I know about screenings and tests to prevent falls? Screening and testing are the best ways to find a health problem early. Early diagnosis and treatment give you the best chance of managing medical conditions that are common after age 77. Certain conditions and lifestyle choices may make you more likely to have a fall. Your health care provider may recommend:  Regular vision checks. Poor vision and conditions such as cataracts can make you more likely to have a fall. If you wear glasses, make sure to get your prescription updated if your vision changes.  Medicine review. Work with your health care provider to regularly review all of the medicines you are taking, including over-the-counter medicines. Ask your health care provider about any side effects that may make you more likely to have a fall. Tell your health care provider if any medicines that you take make you feel dizzy or sleepy.  Osteoporosis screening. Osteoporosis is a condition that causes the bones to get weaker. This can make the bones weak and cause them to break more easily.  Blood pressure screening. Blood pressure changes and medicines to control blood pressure can make you feel dizzy.  Strength and balance checks. Your health care provider may recommend certain tests to check your  strength and balance while standing, walking, or changing positions.  Foot health exam. Foot pain and numbness, as well as not wearing proper footwear, can make you more likely to have a fall.  Depression screening. You may be more likely to have a fall if you have a fear of falling, feel emotionally low, or feel unable to do activities that you used to do.  Alcohol use screening. Using too much alcohol can affect your balance and may make you more likely to have a fall. What actions can I take to lower my risk of falls? General instructions  Talk with your health care provider about your risks for falling. Tell your health care provider if: ? You fall. Be sure to tell your health care provider about all falls, even ones that seem minor. ? You feel dizzy, sleepy, or off-balance.  Take over-the-counter and prescription medicines only as told by your health care provider. These include any supplements.  Eat a healthy diet and maintain a healthy weight. A healthy diet includes low-fat dairy products, low-fat (lean) meats, and fiber from whole grains, beans, and lots of fruits and vegetables. Home safety  Remove any tripping hazards, such as rugs, cords, and clutter.  Install safety equipment such as grab bars in bathrooms and safety rails on stairs.  Keep rooms and walkways well-lit. Activity  Follow a regular exercise program to stay fit. This will help you maintain your balance. Ask your health care provider what types of exercise are appropriate for you.  If you need a cane or walker,   use it as recommended by your health care provider.  Wear supportive shoes that have nonskid soles.   Lifestyle  Do not drink alcohol if your health care provider tells you not to drink.  If you drink alcohol, limit how much you have: ? 0-1 drink a day for women. ? 0-2 drinks a day for men.  Be aware of how much alcohol is in your drink. In the [place] one drink equals one typical bottle of beer (12  oz), one-half glass of wine (5 oz), or one shot of hard liquor (1 oz).  Do not use any products that contain nicotine or tobacco, such as cigarettes and e-cigarettes. If you need help quitting, ask your health care provider. Summary  Having a healthy lifestyle and getting preventive care can help to protect your health and wellness after age 77.  Screening and testing are the best way to find a health problem early and help you avoid having a fall. Early diagnosis and treatment give you the best chance for managing medical conditions that are more common for people who are older than age 77.  Falls are a major cause of broken bones and head injuries in people who are older than age 77. Take precautions to prevent a fall at home.  Work with your health care provider to learn what changes you can make to improve your health and wellness and to prevent falls. This information is not intended to replace advice given to you by your health care provider. Make sure you discuss any questions you have with your health care provider. Document Revised: 05/20/2018 Document Reviewed: 12/10/2016 Elsevier Patient Education  2021 Elsevier Inc.  

## 2020-07-16 NOTE — Progress Notes (Signed)
April Bowers [birth year] [id number]   History:  75 y.o. G3P1020 presents for breast and pelvic exam. Postmenopausal - no HRT, no bleeding. Normal pap and mammogram history. Osteoporosis, was on Fosamax and Actonel in the past but did not tolerate SEs. Since then treatment options have been discussed to include restarting biphosphonate or a new class. She preferred to maximize her calcium and vitamin D and not start further treatment at that time. HTN, hypothyroidism managed by PCP. History of uterine fibroid and left ovarian cyst - most recent pelvic ultrasound 07/19/2019 showed left ovary simple cyst 23 x 18 mm, slightly smaller than previous scan, no change in small uterine fibroid. She requests annual ultrasounds.   Gynecologic History No LMP recorded. Patient is postmenopausal.   Contraception: post menopausal status  Health Maintenance Last Pap: No longer screening per guidelines Last mammogram: 04/2020. Results were: normal Last colonoscopy: 2015 Last Dexa: 08/05/2018. Results were: T-score -2.5 of right femoral neck  Past medical history, past surgical history, family history and social history were all reviewed and documented in the EPIC chart. Married.   ROS:  A ROS was performed and pertinent positives and negatives are included.  Exam:  Vitals:   07/16/20 1344  BP: 120/78  Weight: 154 lb (69.9 kg)  Height: 5\' 1"  (1.549 m)   Body mass index is 29.1 kg/m.  General appearance:  Normal Thyroid:  Symmetrical, normal in size, without palpable masses or nodularity. Respiratory  Auscultation:  Clear without wheezing or rhonchi Cardiovascular  Auscultation:  Regular rate, without rubs, murmurs or gallops  Edema/varicosities:  Not grossly evident Abdominal  Soft,nontender, without masses, guarding or rebound.  Liver/spleen:  No organomegaly noted  Hernia:  None appreciated  Skin  Inspection:  Grossly normal Breasts: Examined lying and sitting.   Right: Without masses,  retractions, nipple discharge or axillary adenopathy.   Left: Without masses, retractions, nipple discharge or axillary adenopathy. Genitourinary   Inguinal/mons:  Normal without inguinal adenopathy  External genitalia:  Normal appearing vulva with no masses, tenderness, or lesions  BUS/Urethra/Skene's glands:  Normal  Vagina:  Normal appearing with normal color and discharge, no lesions. Atrophic changes.   Cervix:  Normal appearing without discharge or lesions  Uterus:  Normal in size, shape and contour.  Midline and mobile, nontender  Adnexa/parametria:     Rt: Normal in size, without masses or tenderness.   Lt: Normal in size, without masses or tenderness.  Anus and perineum: External hemorrhoids, nonbleeding  Digital rectal exam: Normal sphincter tone without palpated masses or tenderness  Assessment/Plan:  75 y.o. G3P1020 for breast and pelvic exam.   Well female exam with routine gynecological exam - Education provided on SBEs, importance of preventative screenings, current guidelines, high calcium diet, regular exercise, and multivitamin daily. Labs with PCP.   Age-related osteoporosis without current pathological fracture - Plan: DG Bone Density. Dexa 08/05/2018 T-score -2.5 of right femoral neck. Was on Fosamax and Actonel in the past but did not tolerate SEs. Since then treatment options have been discussed to include restarting biphosphonate or a new class. She preferred to maximize her calcium and vitamin D and not start further treatment at that time. She will schedule DXA for next month.   Postmenopausal - Plan: DG Bone Density. No HRT, no bleeding.  Cyst of left ovary - Plan: US PELVIS TRANSVAGINAL NON-OB (TV ONLY).  Most recent pelvic ultrasound 07/19/2019 showed left ovary simple cyst 23 x 18 mm, slightly smaller than previous scan, no change in  small uterine fibroid. She requests annual ultrasounds.   Screening for cervical cancer - Normal Pap history. No longer screening  per guidelines.   Screening for breast cancer - Normal mammogram history.  Continue annual screenings.  Normal breast exam today.  Screening for colon cancer - 2015 colonoscopy. Preventative screenings no longer recommended per GI.   Return in 1 year for annual.    Tamela Gammon DNP, 2:03 PM 07/16/2020

## 2020-07-26 ENCOUNTER — Telehealth: Payer: Self-pay | Admitting: Internal Medicine

## 2020-07-26 DIAGNOSIS — E7800 Pure hypercholesterolemia, unspecified: Secondary | ICD-10-CM

## 2020-07-26 NOTE — Telephone Encounter (Signed)
Talked to patient. PA submitted to insurance and awaiting reply.  Will follow up on Monday.

## 2020-07-26 NOTE — Telephone Encounter (Signed)
Pt c/o medication issue:  1. Name of Medication: Repatha  2. How are you currently taking this medication (dosage and times per day)?   3. Are you having a reaction (difficulty breathing--STAT)? No   4. What is your medication issue?   Patient states when she saw the pharmacist on 07/12/20 she was told someone would contact her insurance to see if they will cover Repatha. She hasn't heard from anyone and she is requesting an update.

## 2020-07-30 MED ORDER — REPATHA SURECLICK 140 MG/ML ~~LOC~~ SOAJ: 140.0000 mg | SUBCUTANEOUS | 11 refills | Status: DC

## 2020-07-30 NOTE — Addendum Note (Signed)
Addended by: Allean Found on: 07/30/2020 08:52 AM   Modules accepted: Orders

## 2020-07-30 NOTE — Telephone Encounter (Signed)
Called and spoke w/pt and stated that they are approved for repatha sureclick, rx sent, instructed the pt to complete fasting labs post 4th dose and to call back if the med is unaffordable and the pt voiced understanding

## 2020-07-31 ENCOUNTER — Encounter: Payer: Self-pay | Admitting: Obstetrics and Gynecology

## 2020-07-31 ENCOUNTER — Ambulatory Visit (INDEPENDENT_AMBULATORY_CARE_PROVIDER_SITE_OTHER): Payer: Medicare Other

## 2020-07-31 ENCOUNTER — Other Ambulatory Visit: Payer: Self-pay

## 2020-07-31 ENCOUNTER — Ambulatory Visit (INDEPENDENT_AMBULATORY_CARE_PROVIDER_SITE_OTHER): Payer: Medicare Other | Admitting: Obstetrics and Gynecology

## 2020-07-31 VITALS — BP 118/74

## 2020-07-31 DIAGNOSIS — N83202 Unspecified ovarian cyst, left side: Secondary | ICD-10-CM | POA: Diagnosis not present

## 2020-07-31 NOTE — Progress Notes (Signed)
GYNECOLOGY  VISIT   HPI: 75 y.o.   Married White or Caucasian Not Hispanic or Latino  female   [phone] with No LMP recorded. Patient is postmenopausal.   here for a f/u pelvic ultrasound. History of uterine fibroid and left ovarian cyst - most recent pelvic ultrasound 07/19/2019 showed left ovary simple cyst 23 x 18 mm, slightly smaller than previous scan, no change in small uterine fibroid. She requests annual ultrasounds.  On review of her chart an ultrasound from 04/20/12 showed a thin walled echo free cyst adjacent to her left ovary. It measured 28 x 27 x 21 mm. It has been present since that time.  She reports that she has been followed for this cyst for 25 years, it has never changed.    GYNECOLOGIC HISTORY: No LMP recorded. Patient is postmenopausal. Contraception:postmenopausal Menopausal hormone therapy: none        OB History     Gravida  3   Para  1   Term  1   Preterm      AB  2   Living  0      SAB      IAB      Ectopic      Multiple      Live Births                 Patient Active Problem List   Diagnosis Date Noted   Hypercholesteremia 07/12/2020   Age-related osteoporosis without current pathological fracture 07/12/2019   Fibroids, intramural 07/12/2019   Essential hypertension, benign 04/15/2013   Ovarian cyst    Osteopenia    Asthma    Hypothyroidism     Past Medical History:  Diagnosis Date   Complication of anesthesia    took much longer to awaken from anesthesia-45 yrs ago,last colonoscopy not a problem few yrs ago   Diverticulosis    Elevated cholesterol    Glaucoma    bilateral   Hypertension    Hypothyroidism    Osteoporosis 08/2018   T score -2.5    Past Surgical History:  Procedure Laterality Date   COLONOSCOPY WITH PROPOFOL N/A 03/13/2014   Procedure: COLONOSCOPY WITH PROPOFOL;  Surgeon: Garlan Fair, MD;  Location: [facility];  Service: Endoscopy;  Laterality: N/A;   DILATION AND CURETTAGE OF UTERUS     MISSED  AB   HAND SURGERY     KNEE SURGERY Left    left knee scope    Current Outpatient Medications  Medication Sig Dispense Refill   albuterol (VENTOLIN HFA) 108 (90 Base) MCG/ACT inhaler 2 puffs as needed     amLODipine (NORVASC) 10 MG tablet Take 10 mg by mouth at bedtime.     aspirin EC 81 MG tablet Take 81 mg by mouth daily. Swallow whole.     BIOTIN PO Take 1 tablet by mouth daily.     Cetirizine HCl (ZYRTEC ALLERGY PO) Take by mouth daily.     Cholecalciferol (VITAMIN D) 125 MCG (5000 UT) CAPS 1 capsule     Evolocumab (REPATHA SURECLICK) 270 MG/ML SOAJ Inject 140 mg into the skin every 14 (fourteen) days. 2 mL 11   glucosamine-chondroitin 500-400 MG tablet daily.     latanoprost (XALATAN) 0.005 % ophthalmic solution Place 1 drop into both eyes at bedtime.     levothyroxine (SYNTHROID) 75 MCG tablet Take 1 tablet by mouth daily. Pt takes 75 mcg every third day     levothyroxine (SYNTHROID, LEVOTHROID) 88 MCG tablet Take  88 mcg by mouth. Pt takes 88 mcg as directed     Multiple Vitamin (MULTIVITAMIN WITH MINERALS) TABS tablet Take 1 tablet by mouth daily.     rosuvastatin (CRESTOR) 5 MG tablet 1 tablet     No current facility-administered medications for this visit.     ALLERGIES: Sulfa antibiotics, Atorvastatin, Crestor [rosuvastatin], Methimazole, and Penicillins  Family History  Problem Relation Age of Onset   Hypertension Mother    Heart disease Mother        CVA-DECEASED   Heart disease Sister    Hypertension Father    Hypertension Sister    Heart disease Sister    Diabetes Maternal Grandmother    Heart disease Brother     Social History   Socioeconomic History   Marital status: Married    Spouse name: Not on file   Number of children: Not on file   Years of education: Not on file   Highest education level: Not on file  Occupational History   Not on file  Tobacco Use   Smoking status: Former    Pack years: 0.00    Types: Cigarettes    Quit date: 03/01/1970     Years since quitting: 50.4   Smokeless tobacco: Never  Vaping Use   Vaping Use: Never used  Substance and Sexual Activity   Alcohol use: Yes    Alcohol/week: 0.0 standard drinks    Comment: rarely 1-2 month   Drug use: No   Sexual activity: Not Currently    Birth control/protection: Post-menopausal    Comment: INTERCOURSE AGE 70, SEXUAL PARTNERS LESS THAN 5  Other Topics Concern   Not on file  Social History Narrative   Not on file   Social Determinants of Health   Financial Resource Strain: Not on file  Food Insecurity: Not on file  Transportation Needs: Not on file  Physical Activity: Not on file  Stress: Not on file  Social Connections: Not on file  Intimate Partner Violence: Not on file    ROS  PHYSICAL EXAMINATION:    There were no vitals taken for this visit.    General appearance: alert, cooperative and appears stated age  Pelvic ultrasound  Indications: left adnexal cyst  Findings:  Anteverted Uterus 5.29 x 3.0 x 2.23 cm  Anterior, intramural myoma: 0.78 x 0.51 cm  Endometrium 1.89 mm  Left ovary 3.03 x 2.28 x 2.57 cm  2.23 x 2.23 cm echo free, avascular cyst  Right ovary 0.76 x 0.88 x 1.23 cm  No free fluid   Impression:  Normal sized uterus Stable small intramural myoma Thin symmetrical endometrium Stable simple left adnexal cyst. Images from 2016 and 2021 were compared, no changes.    1. Cyst of left ovary Benign appearing and stable for many years (patient states over 25 years) I don't feel it is necessary for her to have any further imaging Reviewed risks of false + testing  CC: Marny Lowenstein, NP

## 2020-09-11 ENCOUNTER — Other Ambulatory Visit: Payer: Self-pay

## 2020-09-11 ENCOUNTER — Other Ambulatory Visit: Payer: Self-pay | Admitting: Nurse Practitioner

## 2020-09-11 ENCOUNTER — Ambulatory Visit (INDEPENDENT_AMBULATORY_CARE_PROVIDER_SITE_OTHER): Payer: Medicare Other

## 2020-09-11 DIAGNOSIS — M810 Age-related osteoporosis without current pathological fracture: Secondary | ICD-10-CM

## 2020-09-11 DIAGNOSIS — Z780 Asymptomatic menopausal state: Secondary | ICD-10-CM

## 2020-09-13 ENCOUNTER — Ambulatory Visit (INDEPENDENT_AMBULATORY_CARE_PROVIDER_SITE_OTHER): Payer: Medicare Other | Admitting: Podiatry

## 2020-09-13 ENCOUNTER — Encounter: Payer: Self-pay | Admitting: Podiatry

## 2020-09-13 ENCOUNTER — Ambulatory Visit (INDEPENDENT_AMBULATORY_CARE_PROVIDER_SITE_OTHER): Payer: Medicare Other

## 2020-09-13 ENCOUNTER — Other Ambulatory Visit: Payer: Self-pay

## 2020-09-13 ENCOUNTER — Other Ambulatory Visit: Payer: Self-pay | Admitting: Podiatry

## 2020-09-13 DIAGNOSIS — H1045 Other chronic allergic conjunctivitis: Secondary | ICD-10-CM | POA: Insufficient documentation

## 2020-09-13 DIAGNOSIS — M778 Other enthesopathies, not elsewhere classified: Secondary | ICD-10-CM

## 2020-09-13 DIAGNOSIS — M722 Plantar fascial fibromatosis: Secondary | ICD-10-CM

## 2020-09-13 DIAGNOSIS — J309 Allergic rhinitis, unspecified: Secondary | ICD-10-CM | POA: Insufficient documentation

## 2020-09-13 DIAGNOSIS — J4530 Mild persistent asthma, uncomplicated: Secondary | ICD-10-CM | POA: Insufficient documentation

## 2020-09-13 DIAGNOSIS — Z91030 Bee allergy status: Secondary | ICD-10-CM | POA: Insufficient documentation

## 2020-09-13 LAB — HEPATIC FUNCTION PANEL
ALT: 17 IU/L (ref 0–32)
AST: 23 IU/L (ref 0–40)
Albumin: 4.6 g/dL (ref 3.7–4.7)
Alkaline Phosphatase: 91 IU/L (ref 44–121)
Bilirubin Total: 0.4 mg/dL (ref 0.0–1.2)
Bilirubin, Direct: 0.12 mg/dL (ref 0.00–0.40)
Total Protein: 7.1 g/dL (ref 6.0–8.5)

## 2020-09-13 LAB — LIPID PANEL
Chol/HDL Ratio: 2 ratio (ref 0.0–4.4)
Cholesterol, Total: 197 mg/dL (ref 100–199)
HDL: 99 mg/dL (ref >39)
LDL Chol Calc (NIH): 88 mg/dL (ref 0–99)
Triglycerides: 53 mg/dL (ref 0–149)
VLDL Cholesterol Cal: 10 mg/dL (ref 5–40)

## 2020-09-13 MED ORDER — TRIAMCINOLONE ACETONIDE 40 MG/ML IJ SUSP
20.0000 mg | Freq: Once | INTRAMUSCULAR | Status: AC
  Administered 2020-09-13: 20 mg

## 2020-09-13 MED ORDER — METHYLPREDNISOLONE 4 MG PO TBPK: ORAL_TABLET | ORAL | 0 refills | Status: DC

## 2020-09-13 MED ORDER — MELOXICAM 15 MG PO TABS: 15.0000 mg | ORAL_TABLET | Freq: Every day | ORAL | 3 refills | Status: DC

## 2020-09-13 NOTE — Patient Instructions (Signed)

## 2020-09-16 NOTE — Progress Notes (Signed)
Subjective:  Patient ID: April Bowers, female    DOB: Jun 19, 1945,  MRN: [id number] HPI Chief Complaint  Patient presents with   Foot Pain    Plantar heel right - aching x 6 weeks, AM pain, tried ice and Ibuprofen, going on a trip next week   New Patient (Initial Visit)    75 y.o. female presents with the above complaint.   ROS: Denies fever chills nausea vomiting muscle aches pains calf pain back pain chest pain shortness of breath.  Past Medical History:  Diagnosis Date   Complication of anesthesia    took much longer to awaken from anesthesia-45 yrs ago,last colonoscopy not a problem few yrs ago   Diverticulosis    Elevated cholesterol    Glaucoma    bilateral   Hypertension    Hypothyroidism    Osteoporosis 08/2018   T score -2.5   Past Surgical History:  Procedure Laterality Date   COLONOSCOPY WITH PROPOFOL N/A 03/13/2014   Procedure: COLONOSCOPY WITH PROPOFOL;  Surgeon: Garlan Fair, MD;  Location: [facility];  Service: Endoscopy;  Laterality: N/A;   DILATION AND CURETTAGE OF UTERUS     MISSED AB   HAND SURGERY     KNEE SURGERY Left    left knee scope    Current Outpatient Medications:    meloxicam (MOBIC) 15 MG tablet, Take 1 tablet (15 mg total) by mouth daily., Disp: 30 tablet, Rfl: 3   methylPREDNISolone (MEDROL DOSEPAK) 4 MG TBPK tablet, 6 day dose pack - take as directed, Disp: 21 tablet, Rfl: 0   albuterol (VENTOLIN HFA) 108 (90 Base) MCG/ACT inhaler, 2 puffs as needed, Disp: , Rfl:    amLODipine (NORVASC) 10 MG tablet, Take 10 mg by mouth at bedtime., Disp: , Rfl:    aspirin EC 81 MG tablet, Take 81 mg by mouth daily. Swallow whole., Disp: , Rfl:    BIOTIN PO, Take 1 tablet by mouth daily., Disp: , Rfl:    Cetirizine HCl (ZYRTEC ALLERGY PO), Take by mouth daily., Disp: , Rfl:    Cholecalciferol (VITAMIN D) 125 MCG (5000 UT) CAPS, 1 capsule, Disp: , Rfl:    EPINEPHrine (EPIPEN 2-PAK) 0.3 mg/0.3 mL IJ SOAJ injection, See admin instructions., Disp: , Rfl:     Evolocumab (REPATHA SURECLICK) [id number] MG/ML SOAJ, Inject 140 mg into the skin every 14 (fourteen) days., Disp: 2 mL, Rfl: 11   glucosamine-chondroitin 500-400 MG tablet, daily., Disp: , Rfl:    latanoprost (XALATAN) 0.005 % ophthalmic solution, Place 1 drop into both eyes at bedtime., Disp: , Rfl:    levothyroxine (SYNTHROID) 75 MCG tablet, Take 1 tablet by mouth daily. Pt takes 75 mcg every third day, Disp: , Rfl:    levothyroxine (SYNTHROID, LEVOTHROID) 88 MCG tablet, Take 88 mcg by mouth. Pt takes 88 mcg as directed, Disp: , Rfl:    Multiple Vitamin (MULTIVITAMIN WITH MINERALS) TABS tablet, Take 1 tablet by mouth daily., Disp: , Rfl:   Allergies  Allergen Reactions   Sulfa Antibiotics Anaphylaxis   Atorvastatin     Other reaction(s): myalgias   Crestor [Rosuvastatin]     myalgias   Methimazole     Other reaction(s): rash   Penicillins Hives   Review of Systems Objective:  There were no vitals filed for this visit.  General: Well developed, nourished, in no acute distress, alert and oriented x3   Dermatological: Skin is warm, dry and supple bilateral. Nails x 10 are well maintained; remaining integument appears unremarkable at  this time. There are no open sores, no preulcerative lesions, no rash or signs of infection present.  Vascular: Dorsalis Pedis artery and Posterior Tibial artery pedal pulses are 2/4 bilateral with immedate capillary fill time. Pedal hair growth present. No varicosities and no lower extremity edema present bilateral.   Neruologic: Grossly intact via light touch bilateral. Vibratory intact via tuning fork bilateral. Protective threshold with Semmes Wienstein monofilament intact to all pedal sites bilateral. Patellar and Achilles deep tendon reflexes 2+ bilateral. No Babinski or clonus noted bilateral.   Musculoskeletal: No gross boney pedal deformities bilateral. No pain, crepitus, or limitation noted with foot and ankle range of motion bilateral. Muscular  strength 5/5 in all groups tested bilateral.  Pain on palpation medial calcaneal tubercle of the right heel.  No pain on medial lateral compression of the calcaneus.  Gait: Unassisted, Nonantalgic.    Radiographs:  Radiographs taken today demonstrate osseously mature individual soft tissue increase in density plantar fascial cannula insertion site of the right heel.  No acute findings noted.  Assessment & Plan:   Assessment: Planter fasciitis right.  Plan: Discussed etiology pathology conservative surgical therapies I injected the right heel today 20 mg Kenalog 5 mg Marcaine point of maximal tenderness.  Start her on methylprednisolone to be followed by meloxicam.  Placed her in a plantar fascial brace discussed appropriate shoe gear stretching exercise ice therapy and shoe modifications follow-up with her in 1 month remember to ask how her trip went     Narek Kniss T. [place], [place]

## 2020-10-01 ENCOUNTER — Telehealth: Payer: Self-pay | Admitting: Internal Medicine

## 2020-10-01 NOTE — Telephone Encounter (Signed)
Left message for patient to call back  

## 2020-10-01 NOTE — Telephone Encounter (Signed)
Pt is returning a call in regards to her results

## 2020-10-02 ENCOUNTER — Other Ambulatory Visit: Payer: Self-pay

## 2020-10-02 DIAGNOSIS — E7800 Pure hypercholesterolemia, unspecified: Secondary | ICD-10-CM

## 2020-10-03 NOTE — Telephone Encounter (Signed)
Please see Result note:  Rockne Menghini, RPH-CPP  10/02/2020  7:29 AM EDT     Did not get expected drop in LDL after starting Repatha.  Any problems with injections or compliance?  Continue Repatha and repeat labs after 4 more doses.      Allean Found, [facility]  10/02/2020  8:23 AM EDT Back to Top    Called and results given to patient they voiced that they had been compliant and no issues w/injections. I instructed them to complete fasting labs after 4 more doses and  they voiced understanding.     Will close this encounter.

## 2020-10-16 ENCOUNTER — Other Ambulatory Visit: Payer: Self-pay

## 2020-10-16 ENCOUNTER — Ambulatory Visit (INDEPENDENT_AMBULATORY_CARE_PROVIDER_SITE_OTHER): Payer: Medicare Other | Admitting: Podiatry

## 2020-10-16 ENCOUNTER — Encounter: Payer: Self-pay | Admitting: Podiatry

## 2020-10-16 DIAGNOSIS — M2570 Osteophyte, unspecified joint: Secondary | ICD-10-CM

## 2020-10-16 DIAGNOSIS — M722 Plantar fascial fibromatosis: Secondary | ICD-10-CM

## 2020-10-16 NOTE — Progress Notes (Signed)
She presents today states that she is much better now she has good rate related on to be nearly 100% better while she was walking near if she said it was very painful.  She wants to know whether or not she should continue taking her meloxicam.  Objective: Vital signs are stable alert oriented x3 there is no erythema edema cellulitis drainage or odor she has no pain on palpation medial calcaneal tubercle of the right heel.  Assessment: Resolving Planter fasciitis.  Plan: Continue all of her conservative therapies for at least 1 more month with any reoccurrence she is to notify me immediately.

## 2020-11-16 LAB — LIPID PANEL
Chol/HDL Ratio: 1.7 ratio (ref 0.0–4.4)
Cholesterol, Total: 177 mg/dL (ref 100–199)
HDL: 102 mg/dL (ref >39)
LDL Chol Calc (NIH): 65 mg/dL (ref 0–99)
Triglycerides: 51 mg/dL (ref 0–149)
VLDL Cholesterol Cal: 10 mg/dL (ref 5–40)

## 2020-11-16 LAB — HEPATIC FUNCTION PANEL
ALT: 20 IU/L (ref 0–32)
AST: 21 IU/L (ref 0–40)
Albumin: 4.2 g/dL (ref 3.7–4.7)
Alkaline Phosphatase: 85 IU/L (ref 44–121)
Bilirubin Total: 0.5 mg/dL (ref 0.0–1.2)
Bilirubin, Direct: 0.15 mg/dL (ref 0.00–0.40)
Total Protein: 6.3 g/dL (ref 6.0–8.5)

## 2020-12-10 ENCOUNTER — Encounter: Payer: Self-pay | Admitting: Physician Assistant

## 2020-12-10 ENCOUNTER — Ambulatory Visit (INDEPENDENT_AMBULATORY_CARE_PROVIDER_SITE_OTHER): Payer: Medicare Other | Admitting: Physician Assistant

## 2020-12-10 ENCOUNTER — Other Ambulatory Visit: Payer: Self-pay

## 2020-12-10 VITALS — BP 134/70 | HR 67 | Ht 61.5 in | Wt 156.2 lb

## 2020-12-10 DIAGNOSIS — I10 Essential (primary) hypertension: Secondary | ICD-10-CM

## 2020-12-10 DIAGNOSIS — Z79899 Other long term (current) drug therapy: Secondary | ICD-10-CM | POA: Diagnosis not present

## 2020-12-10 DIAGNOSIS — E7800 Pure hypercholesterolemia, unspecified: Secondary | ICD-10-CM | POA: Diagnosis not present

## 2020-12-10 DIAGNOSIS — R911 Solitary pulmonary nodule: Secondary | ICD-10-CM

## 2020-12-10 NOTE — Patient Instructions (Signed)
Medication Instructions:  MAY KEEP YOUR REPATHA UN-REFRIGERATED WHEN YOU ARE TRAVELING UP TO 28 DAYS *If you need a refill on your cardiac medications before your next appointment, please call your pharmacy*  Lab Work: Fasting lipid and lft in 6 months about May 2023 If you have labs (blood work) drawn today and your tests are completely normal, you will receive your results only by:  [place] (if you have MyChart) OR A paper copy in the mail.  If you have any lab test that is abnormal or we need to change your treatment, we will call you to review the results. You may go to any [facility] that is convenient for you however, we do have a lab in our office that is able to assist you. You DO NOT need an appointment for our lab. The lab is open 8:00am and closes at 4:00pm. Lunch 12:45 - 1:45pm.  Follow-Up: Your next appointment:  12 month(s) In Person with You may see Elouise Munroe, MD, HAO MENG, PA-C or one of the following Advanced Practice Providers on your designated Care Team:  Rosaria Ferries, PA-C, Caron Presume, PA-C, Jory Sims, DNP, ANP   Please call our office 2 months in advance to schedule this appointment   At [facility], you and your health needs are our priority.  As part of our continuing mission to provide you with exceptional heart care, we have created designated Provider Care Teams.  These Care Teams include your primary Cardiologist (physician) and Advanced Practice Providers (APPs -  Physician Assistants and Nurse Practitioners) who all work together to provide you with the care you need, when you need it.

## 2020-12-10 NOTE — Progress Notes (Signed)
Cardiology Office Note:    Date:  12/12/2020   ID:  BLONNIE MASKE, DOB October 25, 1945, MRN [id number]  PCP:  April Orn, MD   [facility] Cardiologist:  April Munroe, MD     Referring MD: April Orn, MD   Chief Complaint  Patient presents with   Follow-up    Seen for Dr. Margaretann Bowers    History of Present Illness:    April Bowers is a 75 y.o. female with a hx of HTN, HLD, hypothyroidism treated with radioactive iodine and strong family history of CAD.  She had myalgia previously taking Lipitor and was transitioned to Crestor 5 mg every other day.  Cardiac scoring test obtained on 12/06/2019 showed total agaston score of 132 which placed the patient at 63rd percentile for age and sex matched control, 3 mm nodular density in the left lower lobe.  She was placed on baby aspirin.  The patient was last seen by Dr. Margaretann Bowers on 06/19/2020, given coronary artery calcification, patient was referred to lipid clinic to consider PCSK9 inhibitor.  She was started on Repatha in June.  Most recent blood work obtained on 11/16/2020 showed normal liver function, well-controlled LDL of 65, controlled her total cholesterol, HDL and triglyceride.  [name]  She denies any recent exertional chest pain or worsening dyspnea.  Recent blood work showed very well-controlled lipid panel and a normal liver function test.  She can have repeat fasting lipid panel and a liver function test in 6 months.  I checked with our clinical pharmacy team, if she need to travel, the longest days Repatha can stay out of the refrigerator is 28 days.  Overall, she is doing quite well and can follow-up with Dr. Margaretann Bowers in 1 year.  I discussed with her regarding the previous lung nodule.  She says she smoked temporarily in college, however has not touched a cigarette since.  She does not have family history of lung cancer.  I did not order any repeat CT as she is a low risk candidate.  Will defer to  PCP to see if he would like screening testing in the future.   Past Medical History:  Diagnosis Date   Complication of anesthesia    took much longer to awaken from anesthesia-45 yrs ago,last colonoscopy not a problem few yrs ago   Diverticulosis    Elevated cholesterol    Glaucoma    bilateral   Hypertension    Hypothyroidism    Osteoporosis 08/2018   T score -2.5    Past Surgical History:  Procedure Laterality Date   COLONOSCOPY WITH PROPOFOL N/A 03/13/2014   Procedure: COLONOSCOPY WITH PROPOFOL;  Surgeon: Garlan Fair, MD;  Location: [facility];  Service: Endoscopy;  Laterality: N/A;   DILATION AND CURETTAGE OF UTERUS     MISSED AB   HAND SURGERY     KNEE SURGERY Left    left knee scope    Current Medications: Current Meds  Medication Sig   albuterol (VENTOLIN HFA) 108 (90 Base) MCG/ACT inhaler 2 puffs as needed   amLODipine (NORVASC) 10 MG tablet Take 10 mg by mouth at bedtime.   aspirin EC 81 MG tablet Take 81 mg by mouth daily. Swallow whole.   BIOTIN PO Take 1 tablet by mouth daily.   brimonidine (ALPHAGAN P) 0.1 % SOLN    Cetirizine HCl (ZYRTEC ALLERGY PO) Take by mouth daily.   Cholecalciferol (VITAMIN D) 125 MCG (5000 UT) CAPS 1 capsule  EPINEPHrine 0.3 mg/0.3 mL IJ SOAJ injection See admin instructions.   Evolocumab (REPATHA SURECLICK) 163 MG/ML SOAJ Inject 140 mg into the skin every 14 (fourteen) days.   glucosamine-chondroitin 500-400 MG tablet daily.   latanoprost (XALATAN) 0.005 % ophthalmic solution Place 1 drop into both eyes at bedtime.   levothyroxine (SYNTHROID) 75 MCG tablet Take 1 tablet by mouth daily. Pt takes 75 mcg every third day   levothyroxine (SYNTHROID, LEVOTHROID) 88 MCG tablet Take 88 mcg by mouth. Pt takes 88 mcg as directed   meloxicam (MOBIC) 15 MG tablet Take 1 tablet (15 mg total) by mouth daily.   Multiple Vitamin (MULTIVITAMIN WITH MINERALS) TABS tablet Take 1 tablet by mouth daily.     Allergies:   Sulfa antibiotics,  Atorvastatin, Crestor [rosuvastatin], Methimazole, and Penicillins   Social History   Socioeconomic History   Marital status: Married    Spouse name: Not on file   Number of children: Not on file   Years of education: Not on file   Highest education level: Not on file  Occupational History   Not on file  Tobacco Use   Smoking status: Former    Types: Cigarettes    Quit date: 03/01/1970    Years since quitting: 50.8   Smokeless tobacco: Never  Vaping Use   Vaping Use: Never used  Substance and Sexual Activity   Alcohol use: Yes    Alcohol/week: 0.0 standard drinks    Comment: rarely 1-2 month   Drug use: No   Sexual activity: Not Currently    Birth control/protection: Post-menopausal    Comment: INTERCOURSE AGE 89, SEXUAL PARTNERS LESS THAN 5  Other Topics Concern   Not on file  Social History Narrative   Not on file   Social Determinants of Health   Financial Resource Strain: Not on file  Food Insecurity: Not on file  Transportation Needs: Not on file  Physical Activity: Not on file  Stress: Not on file  Social Connections: Not on file     Family History: The patient's family history includes Diabetes in her maternal grandmother; Heart disease in her brother, mother, sister, and sister; Hypertension in her father, mother, and sister.  ROS:   Please see the history of present illness.     All other systems reviewed and are negative.  EKGs/Labs/Other Studies Reviewed:    The following studies were reviewed today:  Echo 11/17/2018  1. Left ventricular ejection fraction, by visual estimation, is 55 to  60%. The left ventricle has normal function. There is no left ventricular  hypertrophy.   2. Left ventricular diastolic Doppler parameters are consistent with  impaired relaxation pattern of LV diastolic filling.   3. Global right ventricle has normal systolic function.The right  ventricular size is normal. No increase in right ventricular wall  thickness.   4.  Left atrial size was normal.   5. Right atrial size was normal.   6. The mitral valve is normal in structure. Trace mitral valve  regurgitation.   7. The tricuspid valve is normal in structure. Tricuspid valve  regurgitation is trivial.   8. The aortic valve is normal in structure. Aortic valve regurgitation is  trivial by color flow Doppler.   9. The pulmonic valve was normal in structure. Pulmonic valve  regurgitation is not visualized by color flow Doppler.  10. The atrial septum is grossly normal.   EKG:  EKG is not ordered today.    Recent Labs: 11/16/2020: ALT 20  Recent  Lipid Panel    Component Value Date/Time   CHOL 177 11/16/2020 0938   TRIG 51 11/16/2020 0938   HDL 102 11/16/2020 0938   CHOLHDL 1.7 11/16/2020 0938   LDLCALC 65 11/16/2020 0938     Risk Assessment/Calculations:           Physical Exam:    VS:  BP 134/70 (BP Location: Left Arm, Patient Position: Sitting, Cuff Size: Normal)   Pulse 67   Ht 5' 1.5" (1.562 m)   Wt 156 lb 3.2 oz (70.9 kg)   SpO2 100%   BMI 29.04 kg/m     Wt Readings from Last 3 Encounters:  12/10/20 156 lb 3.2 oz (70.9 kg)  07/16/20 154 lb (69.9 kg)  07/12/20 153 lb (69.4 kg)     GEN:  Well nourished, well developed in no acute distress HEENT: Normal NECK: No JVD; No carotid bruits LYMPHATICS: No lymphadenopathy CARDIAC: RRR, no murmurs, rubs, gallops RESPIRATORY:  Clear to auscultation without rales, wheezing or rhonchi  ABDOMEN: Soft, non-tender, non-distended MUSCULOSKELETAL:  No edema; No deformity  SKIN: Warm and dry NEUROLOGIC:  Alert and oriented x 3 PSYCHIATRIC:  Normal affect   ASSESSMENT:    1. Hypercholesteremia   2. Essential hypertension, benign   3. Medication management   4. Lung nodule    PLAN:    In order of problems listed above:  Hyperlipidemia: Continue Praluent, very well-controlled lipid panel.  A repeat fasting lipid panel in IFT in 6 months.  Hypertension: Blood pressure well  controlled on current therapy  Lung nodule: Previously noted to have a 3 mm left lower lobe lung nodule on CT imaging October 2021.  Per radiologist recommendation, if low risk no further studies needed, if high risk, consider repeat study in 12 months.  Will defer to PCP to decide if additional work-up is needed.    Medication Adjustments/Labs and Tests Ordered: Current medicines are reviewed at length with the patient today.  Concerns regarding medicines are outlined above.  Orders Placed This Encounter  Procedures   Lipid panel   Hepatic function panel   No orders of the defined types were placed in this encounter.   Patient Instructions  Medication Instructions:  MAY KEEP YOUR REPATHA UN-REFRIGERATED WHEN YOU ARE TRAVELING UP TO 28 DAYS *If you need a refill on your cardiac medications before your next appointment, please call your pharmacy*  Lab Work: Fasting lipid and lft in 6 months about May 2023 If you have labs (blood work) drawn today and your tests are completely normal, you will receive your results only by:  [place] (if you have MyChart) OR A paper copy in the mail.  If you have any lab test that is abnormal or we need to change your treatment, we will call you to review the results. You may go to any [facility] that is convenient for you however, we do have a lab in our office that is able to assist you. You DO NOT need an appointment for our lab. The lab is open 8:00am and closes at 4:00pm. Lunch 12:45 - 1:45pm.  Follow-Up: Your next appointment:  12 month(s) In Person with You may see April Munroe, MD, Maryclare Nydam, PA-C or one of the following Advanced Practice Providers on your designated Care Team:  Rosaria Ferries, PA-C, Caron Presume, PA-C, Jory Sims, DNP, ANP   Please call our office 2 months in advance to schedule this appointment   At [facility], you and your health needs  are our priority.  As part of our continuing mission to provide you  with exceptional heart care, we have created designated Provider Care Teams.  These Care Teams include your primary Cardiologist (physician) and Advanced Practice Providers (APPs -  Physician Assistants and Nurse Practitioners) who all work together to provide you with the care you need, when you need it.       Hilbert Corrigan, [place]  12/12/2020 9:44 PM    [facility]

## 2020-12-12 ENCOUNTER — Encounter: Payer: Self-pay | Admitting: Physician Assistant

## 2020-12-18 ENCOUNTER — Telehealth: Payer: Self-pay | Admitting: *Deleted

## 2020-12-18 NOTE — Telephone Encounter (Signed)
Patient is calling because her right foot is swelling, painful within the last couple of weeks, more on the instep of the foot. Please schedule for sooner f/u appointment.

## 2020-12-21 ENCOUNTER — Ambulatory Visit: Payer: Medicare Other | Admitting: Physician Assistant

## 2021-01-22 ENCOUNTER — Other Ambulatory Visit: Payer: Self-pay

## 2021-01-22 ENCOUNTER — Ambulatory Visit (INDEPENDENT_AMBULATORY_CARE_PROVIDER_SITE_OTHER): Payer: Medicare Other | Admitting: Podiatry

## 2021-01-22 DIAGNOSIS — M79671 Pain in right foot: Secondary | ICD-10-CM

## 2021-01-22 DIAGNOSIS — L84 Corns and callosities: Secondary | ICD-10-CM

## 2021-01-22 DIAGNOSIS — M5136 Other intervertebral disc degeneration, lumbar region: Secondary | ICD-10-CM | POA: Insufficient documentation

## 2021-01-22 DIAGNOSIS — Z8249 Family history of ischemic heart disease and other diseases of the circulatory system: Secondary | ICD-10-CM | POA: Insufficient documentation

## 2021-01-22 DIAGNOSIS — L603 Nail dystrophy: Secondary | ICD-10-CM

## 2021-01-22 DIAGNOSIS — K5730 Diverticulosis of large intestine without perforation or abscess without bleeding: Secondary | ICD-10-CM | POA: Insufficient documentation

## 2021-01-22 DIAGNOSIS — M169 Osteoarthritis of hip, unspecified: Secondary | ICD-10-CM | POA: Insufficient documentation

## 2021-01-22 DIAGNOSIS — L600 Ingrowing nail: Secondary | ICD-10-CM | POA: Diagnosis not present

## 2021-01-22 DIAGNOSIS — I493 Ventricular premature depolarization: Secondary | ICD-10-CM | POA: Insufficient documentation

## 2021-01-22 DIAGNOSIS — G8929 Other chronic pain: Secondary | ICD-10-CM | POA: Insufficient documentation

## 2021-01-22 DIAGNOSIS — M19049 Primary osteoarthritis, unspecified hand: Secondary | ICD-10-CM | POA: Insufficient documentation

## 2021-01-22 DIAGNOSIS — E890 Postprocedural hypothyroidism: Secondary | ICD-10-CM | POA: Insufficient documentation

## 2021-01-22 DIAGNOSIS — I499 Cardiac arrhythmia, unspecified: Secondary | ICD-10-CM | POA: Insufficient documentation

## 2021-01-22 NOTE — Progress Notes (Signed)
°  Subjective:  Patient ID: April Bowers, female    DOB: [birth year],  MRN: [id number]  April Bowers presents to clinic today for painful elongated mycotic toenails 1-5 bilaterally which are tender when wearing enclosed shoe gear. Pain is relieved with periodic professional debridement.  Patient states right great toenail cracked and may be ingrown. Denies any redness, drainage or swelling of digit.  PCP is Lavone Orn, MD , and last visit was 12/25/2020.  Allergies  Allergen Reactions   Sulfa Antibiotics Anaphylaxis    Other reaction(s): major hives, eyes swollen   Atorvastatin     Other reaction(s): myalgias   Levothyroxine Sodium     Other reaction(s): not as effective as brand Synthoid   Methimazole     Other reaction(s): rash   Penicillins Hives    Other reaction(s): hives   Rosuvastatin     myalgias Other reaction(s): Unknown    Review of Systems: Negative except as noted in the HPI. Objective:   Constitutional April Bowers is a pleasant 75 y.o. Caucasian female, WD, WN in NAD. AAO x 3.   Vascular CFT immediate b/l LE. Palpable DP/PT pulses b/l LE. Digital hair present b/l. Skin temperature gradient WNL b/l. No pain with calf compression b/l. No edema noted b/l. No cyanosis or clubbing noted b/l LE.  Neurologic Normal speech. Oriented to person, place, and time. Protective sensation intact 5/5 intact bilaterally with 10g monofilament b/l.  Dermatologic Pedal integument with normal turgor, texture and tone BLE. No open wounds b/l LE. No interdigital macerations noted b/l LE. Incurvated nailplate medial border(s) R hallux.  Nail border hypertrophy absent. There is tenderness to palpation. Sign(s) of infection: no clinical signs of infection noted on examination today.. Hyperkeratotic lesion(s) right 4th webspace, right foot, R hallux, 5th MPJ left foot, and 5th MPJ right foot.  No erythema, no edema, no drainage, no fluctuance.  Orthopedic: Normal muscle strength 5/5 to all lower  extremity muscle groups bilaterally. Hallux valgus with bunion deformity noted left lower extremity. Hammertoe(s) noted to the R 5th toe. Pes planus deformity noted bilateral LE.Marland Kitchen No pain, crepitus or joint limitation noted with ROM b/l LE.  Patient ambulates independently without assistive aids.   Radiographs: None   Assessment:   1. Corns and callosities   2. Ingrown toenail without infection   3. Pain in both feet    Plan:  Patient was evaluated and treated and all questions answered. Consent given for treatment as described below: -Examined patient. -Medicare ABN signed for this year. Patient consents for services of paring of corns/calluses > 4  today. Copy has been placed in patient's chart. -Continue foot and shoe inspections daily. Monitor blood glucose per PCP/Endocrinologist's recommendations. -Offending nail border debrided and curretaged R hallux utilizing sterile nail nipper and currette. Border cleansed with alcohol and triple antibiotic applied. No further treatment required by patient/caregiver. -Corn(s) R 5th toe and callus(es) left foot, right foot, and 5th MPJ b/l were pared utilizing sterile scalpel blade without incident. Total number debrided =5. -Patient/POA to call should there be question/concern in the interim.  Return in about 3 months (around 04/22/2021).  Marzetta Board, DPM

## 2021-01-22 NOTE — Patient Instructions (Signed)
Urea 40% to calluses only once daily when they build up. Use pumice stone to file them down. Don't be too aggressive.   Corns and Calluses Corns are small areas of thickened skin that form on the top, sides, or tip of a toe. Corns have a cone-shaped core with a point that can press on a nerve below. This causes pain. Calluses are areas of thickened skin that can form anywhere on the body, including the hands, fingers, palms, soles of the feet, and heels. Calluses are usually larger than corns. What are the causes? Corns and calluses are caused by rubbing (friction) or pressure, such as from shoes that are too tight or do not fit properly. What increases the risk? Corns are more likely to develop in people who have misshapen toes (toe deformities), such as hammer toes. Calluses can form with friction to any area of the skin. They are more likely to develop in people who: Work with their hands. Wear shoes that fit poorly, are too tight, or are high-heeled. Have toe deformities. What are the signs or symptoms? Symptoms of a corn or callus include: A hard growth on the skin. Pain or tenderness under the skin. Redness and swelling. Increased discomfort while wearing tight-fitting shoes, if your feet are affected. If a corn or callus becomes infected, symptoms may include: Redness and swelling that gets worse. Pain. Fluid, blood, or pus draining from the corn or callus. How is this diagnosed? Corns and calluses may be diagnosed based on your symptoms, your medical history, and a physical exam. How is this treated? Treatment for corns and calluses may include: Removing the cause of the friction or pressure. This may involve: Changing your shoes. Wearing shoe inserts (orthotics) or other protective layers in your shoes, such as a corn pad. Wearing gloves. Applying medicine to the skin (topical medicine) to help soften skin in the hardened, thickened areas. Removing layers of dead skin with a  file to reduce the size of the corn or callus. Removing the corn or callus with a scalpel or laser. Taking antibiotic medicines, if your corn or callus is infected. Having surgery, if a toe deformity is the cause. Follow these instructions at home:  Take over-the-counter and prescription medicines only as told by your health care provider. If you were prescribed an antibiotic medicine, take it as told by your health care provider. Do not stop taking it even if your condition improves. Wear shoes that fit well. Avoid wearing high-heeled shoes and shoes that are too tight or too loose. Wear any padding, protective layers, gloves, or orthotics as told by your health care provider. Soak your hands or feet. Then use a file or pumice stone to soften your corn or callus. Do this as told by your health care provider. Check your corn or callus every day for signs of infection. Contact a health care provider if: Your symptoms do not improve with treatment. You have redness or swelling that gets worse. Your corn or callus becomes painful. You have fluid, blood, or pus coming from your corn or callus. You have new symptoms. Get help right away if: You develop severe pain with redness. Summary Corns are small areas of thickened skin that form on the top, sides, or tip of a toe. These can be painful. Calluses are areas of thickened skin that can form anywhere on the body, including the hands, fingers, palms, and soles of the feet. Calluses are usually larger than corns. Corns and calluses are caused  by rubbing (friction) or pressure, such as from shoes that are too tight or do not fit properly. Treatment may include wearing padding, protective layers, gloves, or orthotics as told by your health care provider. This information is not intended to replace advice given to you by your health care provider. Make sure you discuss any questions you have with your health care provider. Document Revised:  05/26/2019 Document Reviewed: 05/26/2019 Elsevier Patient Education  2022 [employer].

## 2021-01-25 ENCOUNTER — Other Ambulatory Visit: Payer: Self-pay | Admitting: Podiatry

## 2021-01-27 ENCOUNTER — Encounter: Payer: Self-pay | Admitting: Podiatry

## 2021-01-27 NOTE — Telephone Encounter (Signed)
Please advise 

## 2021-02-12 ENCOUNTER — Other Ambulatory Visit: Payer: Self-pay | Admitting: Nurse Practitioner

## 2021-02-12 DIAGNOSIS — K649 Unspecified hemorrhoids: Secondary | ICD-10-CM

## 2021-02-12 NOTE — Telephone Encounter (Signed)
AEX and mammo UTD.

## 2021-03-04 ENCOUNTER — Other Ambulatory Visit: Payer: Self-pay | Admitting: Podiatry

## 2021-04-29 ENCOUNTER — Ambulatory Visit: Payer: Medicare Other | Admitting: Podiatry

## 2021-05-06 ENCOUNTER — Encounter: Payer: Self-pay | Admitting: Nurse Practitioner

## 2021-06-10 ENCOUNTER — Other Ambulatory Visit: Payer: Self-pay | Admitting: Podiatry

## 2021-06-14 LAB — LIPID PANEL
Chol/HDL Ratio: 1.8 ratio (ref 0.0–4.4)
Cholesterol, Total: 186 mg/dL (ref 100–199)
HDL: 101 mg/dL (ref >39)
LDL Chol Calc (NIH): 71 mg/dL (ref 0–99)
Triglycerides: 75 mg/dL (ref 0–149)
VLDL Cholesterol Cal: 14 mg/dL (ref 5–40)

## 2021-06-14 LAB — HEPATIC FUNCTION PANEL
ALT: 19 IU/L (ref 0–32)
AST: 19 IU/L (ref 0–40)
Albumin: 4.5 g/dL (ref 3.7–4.7)
Alkaline Phosphatase: 97 IU/L (ref 44–121)
Bilirubin Total: 0.4 mg/dL (ref 0.0–1.2)
Bilirubin, Direct: 0.13 mg/dL (ref 0.00–0.40)
Total Protein: 6.8 g/dL (ref 6.0–8.5)

## 2021-07-08 ENCOUNTER — Other Ambulatory Visit: Payer: Self-pay | Admitting: Internal Medicine

## 2021-07-11 ENCOUNTER — Telehealth: Payer: Self-pay | Admitting: Internal Medicine

## 2021-07-11 IMAGING — CT CT CARDIAC CORONARY ARTERY CALCIUM SCORE
3 series · 14 of 20 positions shown, 16 images · non-contrast
Comparison: None.

CLINICAL DATA: 74-year-old white female with elevated cholesterol.

EXAM:
CT CARDIAC CORONARY ARTERY CALCIUM SCORE
TECHNIQUE: Non-contrast imaging through the heart was performed using
prospective ECG gating. Image post processing was performed on an
independent workstation, allowing for quantitative analysis of the
heart and coronary arteries. Note that this exam targets the heart
and the chest was not imaged in its entirety.

[Series 2: calcium scoring 2.00 qr36 bestdiast 69% hrt calciu · axial · 0.38mm/px · z∈[+1545,+1629]mm · 4 of 70 slices shown]
[im 14/70  vessel]
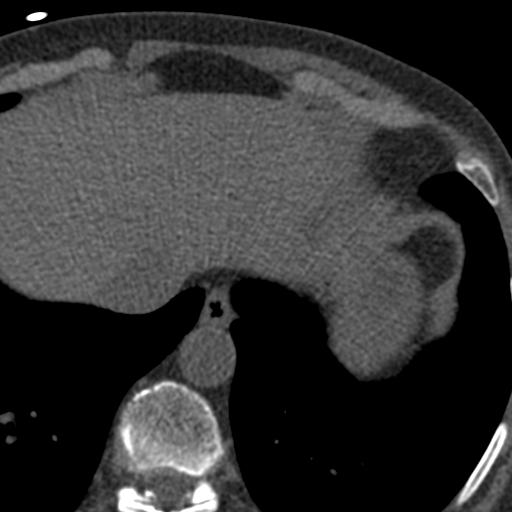
[im 28/70  vessel]
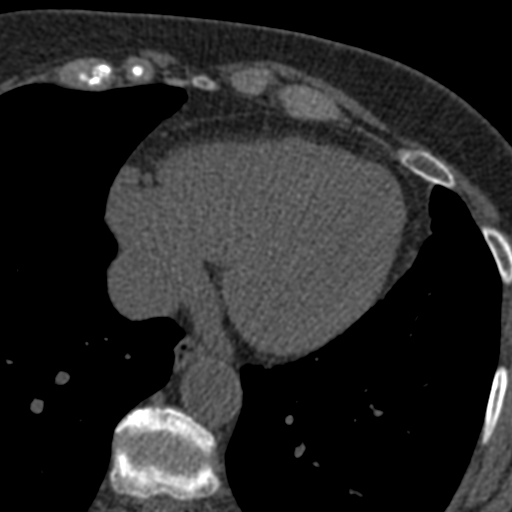
[im 42/70  vessel]
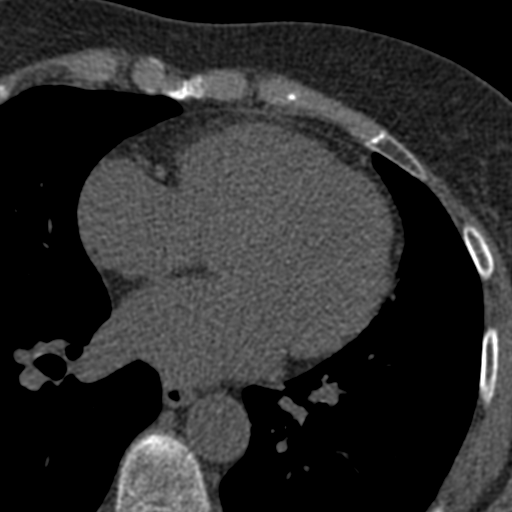
[im 56/70  vessel]
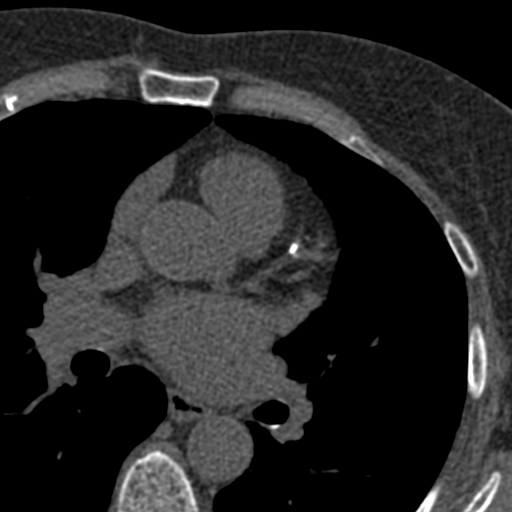

[Series 3: calcium scoring 2.00 br40 bestdiast 69% axial · axial · 0.51mm/px · z∈[+1541,+1633]mm · 5 of 70 slices shown, 7 images]
[im 12/70  vessel]
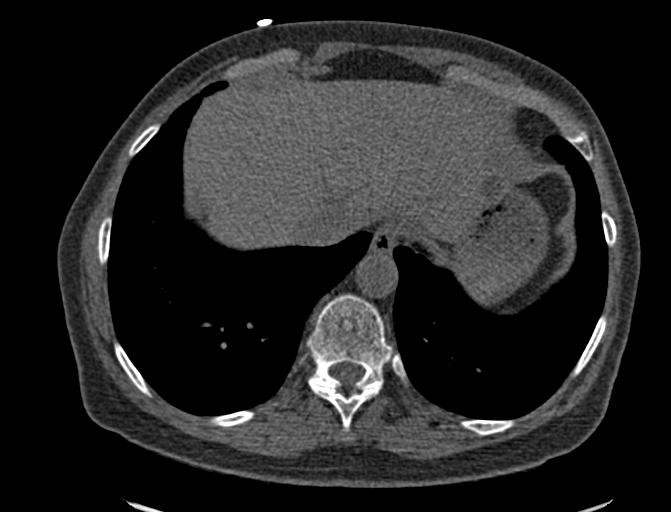
[im 12/70  lung]
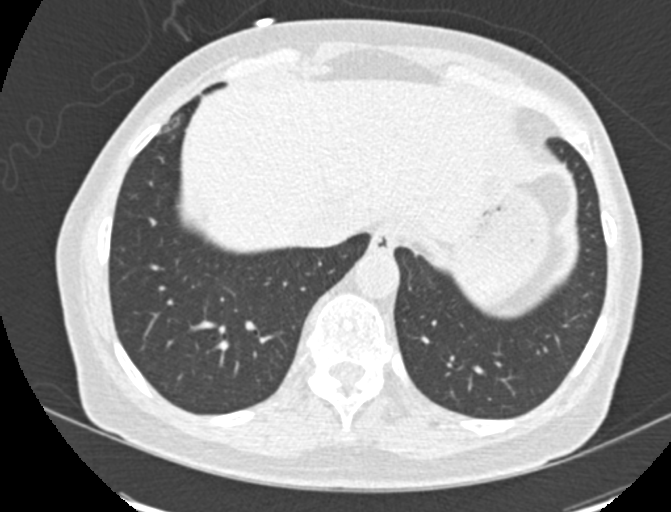
[im 24/70  vessel]
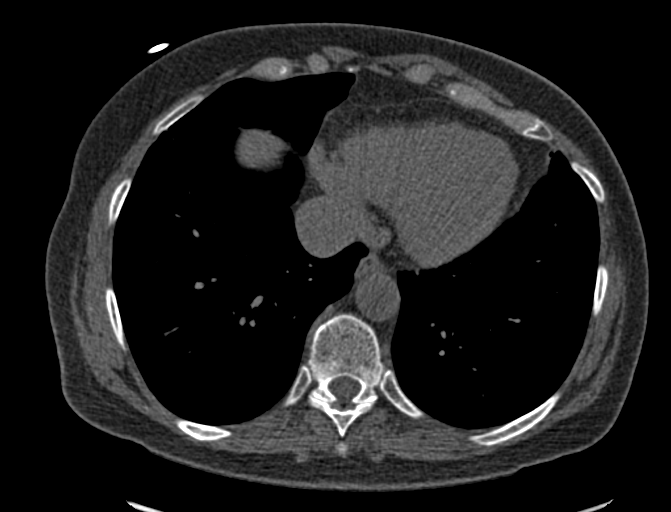
[im 35/70  vessel]
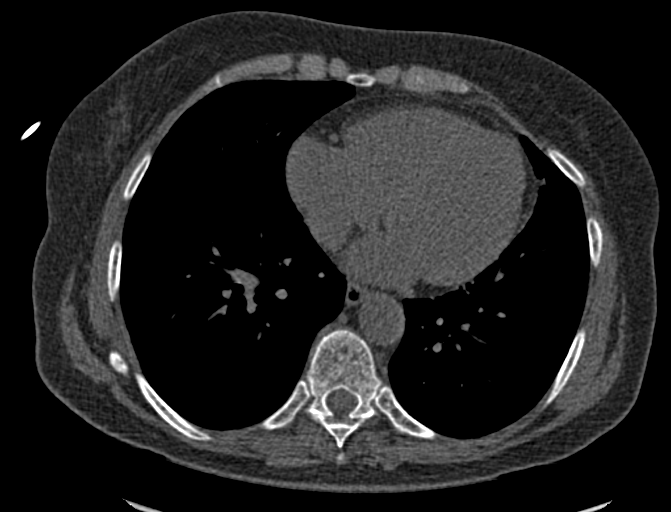
[im 47/70  vessel]
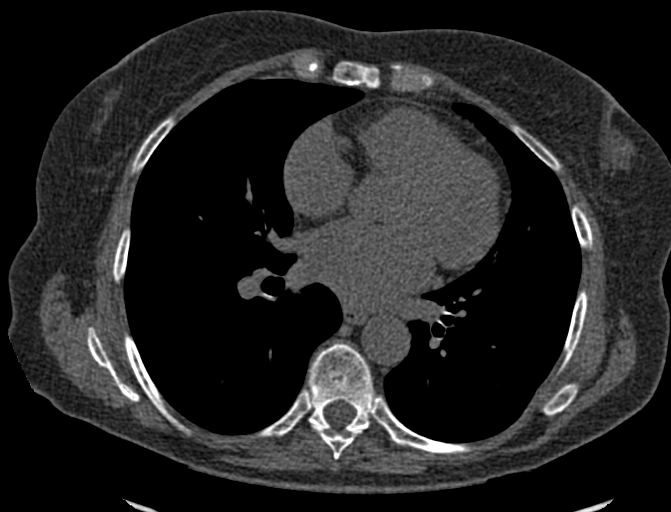
[im 58/70  vessel]
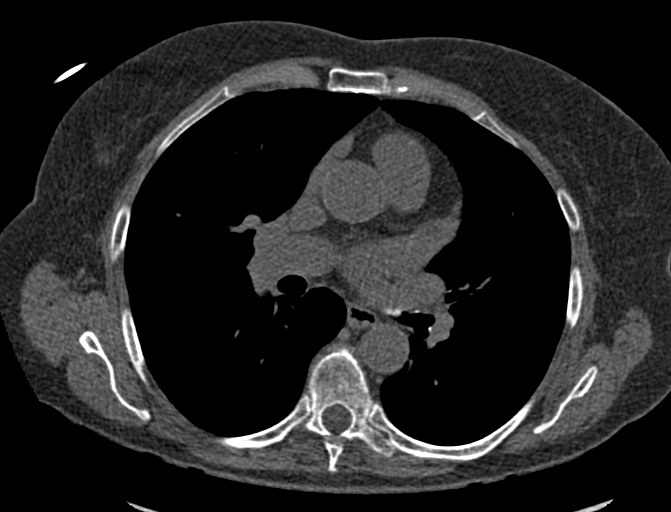
[im 58/70  lung]
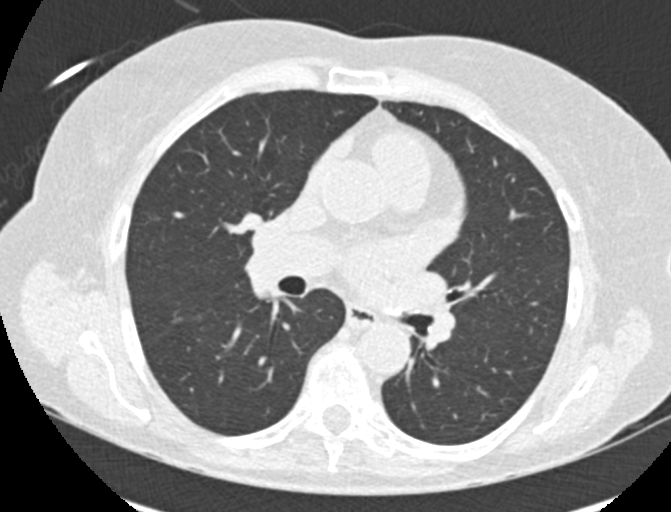

[Series 9: calcium scoring 2.00 br60 bestdiast 69% lungs · axial · 0.51mm/px · z∈[+1541,+1633]mm · 5 of 70 slices shown]
[im 12/70  vessel]
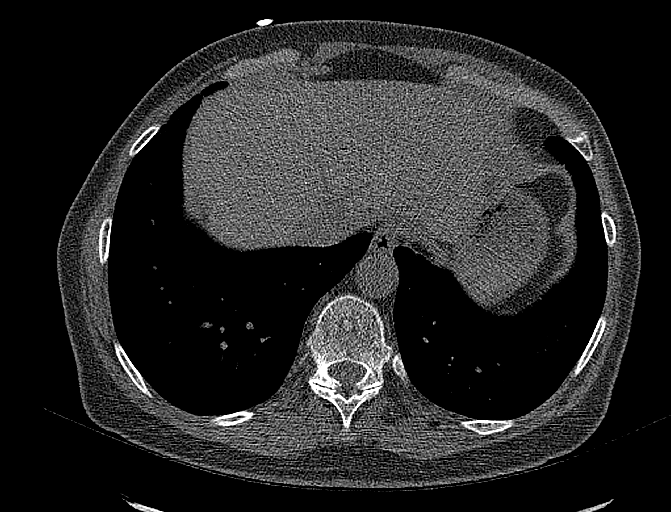
[im 24/70  vessel]
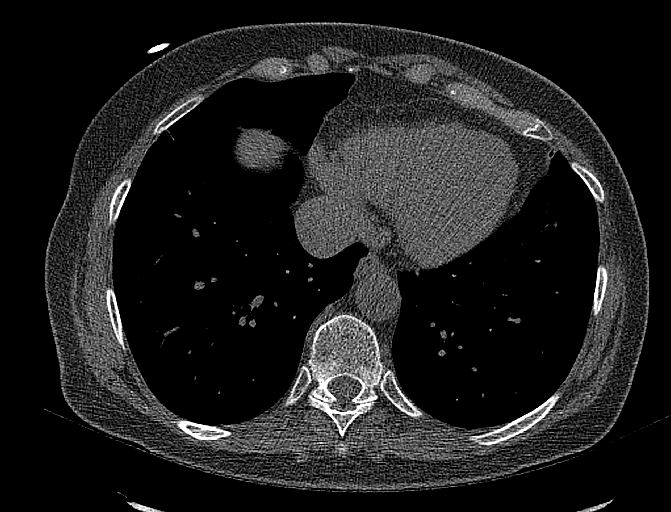
[im 35/70  vessel]
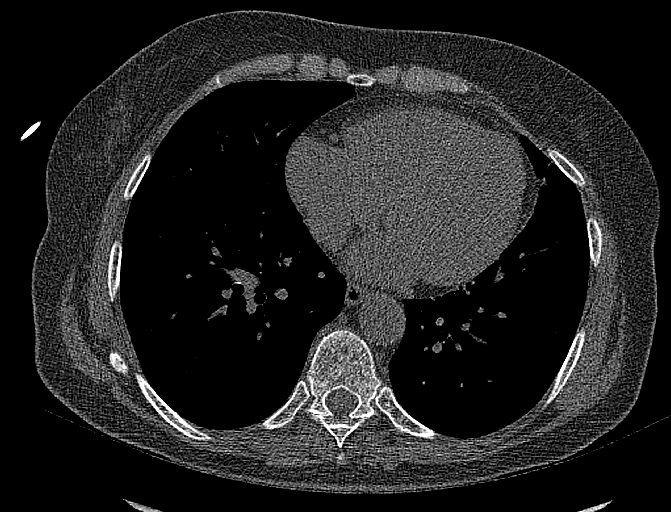
[im 47/70  vessel]
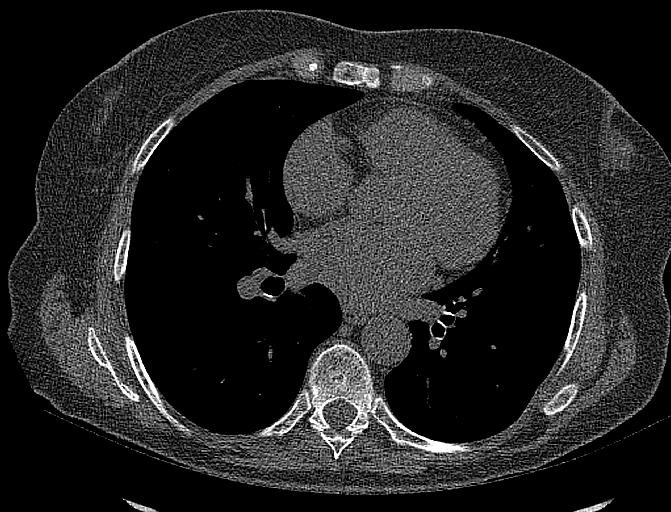
[im 58/70  vessel]
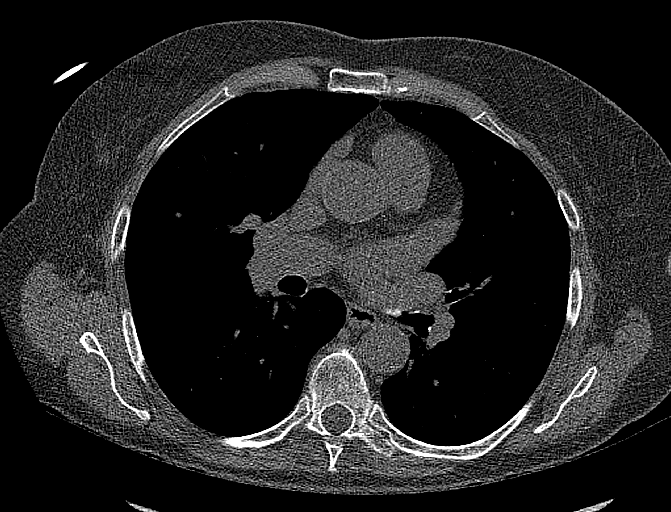

[14 of 20 positions shown; findings below may reference images not displayed]

FINDINGS: CORONARY CALCIUM SCORES:

Left Main: 0

LAD: 111

LCx: 0

RCA:

Total Agatston Score: 132

[HOSPITAL] percentile: 63

AORTA MEASUREMENTS:

Ascending Aorta: 33 mm

Descending Aorta: 27 mm

OTHER FINDINGS:

Heart size is normal. No significant pericardial fluid. Visualized
mediastinal structures are unremarkable. No significant
lymphadenopathy in the visualized chest. Low-density structure at
the lateral left hepatic dome measures up to 1.1 cm and Hounsfield
units suggests this could represent a hepatic cyst. Otherwise, the
visualized upper abdominal structures are unremarkable. No large
pleural effusions. Small nodular density in left lower lobe measures
roughly 3 mm sequence 5, image 70. Otherwise, the visualized lungs
are clear. No significant airspace disease or consolidation. No
acute bone abnormality.
IMPRESSION: 1. Coronary calcium score is 132 and this is at percentile 63 for
patients of the same age, gender and ethnicity.
2. 3 mm nodular density in the left lower lobe is nonspecific. No
follow-up needed if patient is low-risk. Non-contrast chest CT can
be considered in 12 months if patient is high-risk. This
recommendation follows the consensus statement: Guidelines for
Management of Incidental Pulmonary Nodules Detected on CT Images:

## 2021-07-11 MED ORDER — REPATHA SURECLICK 140 MG/ML ~~LOC~~ SOAJ: 140.0000 mg | SUBCUTANEOUS | 3 refills | Status: DC

## 2021-07-11 NOTE — Telephone Encounter (Signed)
*  STAT* If patient is at the pharmacy, call can be transferred to refill team.   1. Which medications need to be refilled? (please list name of each medication and dose if known) Evolocumab (REPATHA SURECLICK) 458 MG/ML SOAJ  2. Which pharmacy/location (including street and city if local pharmacy) is medication to be sent to? [place], [place]  3. Do they need a 30 day or 90 day supply? Inject 140 mg into the skin every 14 (fourteen) days

## 2021-07-11 NOTE — Telephone Encounter (Signed)
Refill processed

## 2021-07-22 NOTE — Progress Notes (Signed)
April Bowers 03-Dec-1945 [id number]   History:  76 y.o. G3P1020 presents for breast and pelvic exam. Postmenopausal - no HRT, no bleeding. Normal pap and mammogram history. Osteoporosis, was on Fosamax and Actonel in the past but did not tolerate. Since then treatment options have been discussed to include restarting biphosphonate or a new class. She prefers to maximize her calcium and vitamin D. Very active with spin and walking. History of uterine fibroid and left ovarian cyst - has been followed for over 25 years with no change, most recent 07/2020 showed stability with recommendations to stop annual imaging unless she becomes symptomatic and she is agreeable. HTN, hypothyroidism managed by PCP.   Gynecologic History No LMP recorded. Patient is postmenopausal.   Contraception: post menopausal status Sexually active: No  Health Maintenance Last Pap: 03/04/2010. Results were: Normal Last mammogram: 05/06/2021. Results were: Normal Last colonoscopy: 2016 Last Dexa: 09/11/2020. Results were: T-score -2.6 of right femoral neck, all other sites osteopenic  Past medical history, past surgical history, family history and social history were all reviewed and documented in the EPIC chart. Married. 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 Husband just had back surgery, doing well.   ROS:  A ROS was performed and pertinent positives and negatives are included.  Exam:  Vitals:   07/23/21 0759  BP: 122/74  Weight: 156 lb (70.8 kg)  Height: '5\' 1"'$  (1.549 m)    Body mass index is 29.48 kg/m.  General appearance:  Normal Thyroid:  Symmetrical, normal in size, without palpable masses or nodularity. Respiratory  Auscultation:  Clear without wheezing or rhonchi Cardiovascular  Auscultation:  Regular rate, without rubs, murmurs or gallops  Edema/varicosities:  Not grossly evident Abdominal  Soft,nontender, without masses, guarding or rebound.  Liver/spleen:  No organomegaly noted  Hernia:   None appreciated  Skin  Inspection:  Grossly normal Breasts: Examined lying and sitting.   Right: Without masses, retractions, nipple discharge or axillary adenopathy.   Left: Without masses, retractions, nipple discharge or axillary adenopathy. Genitourinary   Inguinal/mons:  Normal without inguinal adenopathy  External genitalia:  Normal appearing vulva with no masses, tenderness, or lesions  BUS/Urethra/Skene's glands:  Normal  Vagina:  Normal appearing with normal color and discharge, no lesions. Atrophic changes  Cervix:  Normal appearing without discharge or lesions  Uterus:  Normal in size, shape and contour.  Midline and mobile, nontender  Adnexa/parametria:     Rt: Normal in size, without masses or tenderness.   Lt: Normal in size, without masses or tenderness.  Anus and perineum: External hemorrhoids, nonbleeding  Digital rectal exam: Normal sphincter tone without palpated masses or tenderness  Patient informed chaperone available to be present for breast and pelvic exam. Patient has requested no chaperone to be present. Patient has been advised what will be completed during breast and pelvic exam.    Assessment/Plan:  76 y.o. G3P1020 for breast and pelvic exam.   Well female exam with routine gynecological exam - Education provided on SBEs, importance of preventative screenings, current guidelines, high calcium diet, regular exercise, and multivitamin daily. Labs with PCP.   Age-related osteoporosis without current pathological fracture - DXA 68/2022 T-score -2.6 of right femoral neck, all other sites osteopenic. Stable from previous DXA in 2020. Was on Fosamax and Actonel in the past but did not tolerate SEs. Since then treatment options have been discussed to include restarting biphosphonate or a new class. She preferred to maximize her calcium and vitamin D. Very active with spin and  walking. Will repeat next August.   Postmenopausal - No HRT, no bleeding.  Screening for  cervical cancer - Normal Pap history. No longer screening per guidelines.   Screening for breast cancer - Normal mammogram history.  Continue annual screenings.  Normal breast exam today.  Screening for colon cancer - 2016 colonoscopy. Preventative screenings no longer recommended per GI.   Return in 2 years for breast and pelvic exam.     Tamela Gammon DNP, 8:37 AM 07/23/2021

## 2021-07-23 ENCOUNTER — Encounter: Payer: Self-pay | Admitting: Nurse Practitioner

## 2021-07-23 ENCOUNTER — Ambulatory Visit (INDEPENDENT_AMBULATORY_CARE_PROVIDER_SITE_OTHER): Payer: Medicare Other | Admitting: Nurse Practitioner

## 2021-07-23 VITALS — BP 122/74 | Ht 61.0 in | Wt 156.0 lb

## 2021-07-23 DIAGNOSIS — Z01419 Encounter for gynecological examination (general) (routine) without abnormal findings: Secondary | ICD-10-CM

## 2021-07-23 DIAGNOSIS — M810 Age-related osteoporosis without current pathological fracture: Secondary | ICD-10-CM

## 2021-07-23 DIAGNOSIS — Z780 Asymptomatic menopausal state: Secondary | ICD-10-CM

## 2021-12-06 ENCOUNTER — Other Ambulatory Visit: Payer: Self-pay | Admitting: Physician Assistant

## 2021-12-06 DIAGNOSIS — N838 Other noninflammatory disorders of ovary, fallopian tube and broad ligament: Secondary | ICD-10-CM

## 2021-12-10 ENCOUNTER — Ambulatory Visit
Admission: RE | Admit: 2021-12-10 | Discharge: 2021-12-10 | Disposition: A | Discharge status: Home / Self Care | Payer: Medicare Other | Source: Ambulatory Visit | Attending: Physician Assistant | Admitting: Physician Assistant

## 2021-12-10 DIAGNOSIS — N838 Other noninflammatory disorders of ovary, fallopian tube and broad ligament: Secondary | ICD-10-CM

## 2021-12-13 ENCOUNTER — Ambulatory Visit: Payer: Medicare Other | Attending: Internal Medicine | Admitting: Internal Medicine

## 2021-12-13 ENCOUNTER — Encounter: Payer: Self-pay | Admitting: Internal Medicine

## 2021-12-13 VITALS — BP 146/68 | HR 67 | Ht 62.0 in | Wt 158.0 lb

## 2021-12-13 DIAGNOSIS — E785 Hyperlipidemia, unspecified: Secondary | ICD-10-CM | POA: Diagnosis not present

## 2021-12-13 DIAGNOSIS — I2510 Atherosclerotic heart disease of native coronary artery without angina pectoris: Secondary | ICD-10-CM | POA: Diagnosis not present

## 2021-12-13 DIAGNOSIS — I10 Essential (primary) hypertension: Secondary | ICD-10-CM

## 2021-12-13 DIAGNOSIS — I2584 Coronary atherosclerosis due to calcified coronary lesion: Secondary | ICD-10-CM

## 2021-12-13 NOTE — Patient Instructions (Signed)
Medication Instructions:  No Changes In Medications at this time. *If you need a refill on your cardiac medications before your next appointment, please call your pharmacy*  Lab Work: None Ordered At This Time.  If you have labs (blood work) drawn today and your tests are completely normal, you will receive your results only by: MyChart Message (if you have MyChart) OR A paper copy in the mail If you have any lab test that is abnormal or we need to change your treatment, we will call you to review the results.  Testing/Procedures: None Ordered At This Time.   Follow-Up: At [facility], you and your health needs are our priority.  As part of our continuing mission to provide you with exceptional heart care, we have created designated Provider Care Teams.  These Care Teams include your primary Cardiologist (physician) and Advanced Practice Providers (APPs -  Physician Assistants and Nurse Practitioners) who all work together to provide you with the care you need, when you need it.  Your next appointment:   1 year(s)  The format for your next appointment:   In Person  Provider:   Gayatri A Acharya, MD           

## 2021-12-13 NOTE — Progress Notes (Signed)
Cardiology Office Note:    Date:  12/13/2021   ID:  April Bowers, DOB 09/11/1945, MRN [id number]  PCP:  Lavone Orn, MD  Cardiologist:  Elouise Munroe, MD  Electrophysiologist:  None   Referring MD: Lavone Orn, MD   Chief Complaint/Reason for Referral: Coronary artery disease as noted by elevated coronary artery calcium score  History of Present Illness:    April Bowers is a 76 y.o. female with a history of HTN, HLD, strong family history of CAD, hyperthyroidism treated with radioactive iodine now hypothyroidism, mild intermittent asthma, GERD who presents for evaluation after having a coronary calcium score of 132, 63rd percentile.  Last visit, she endorsed decreasing Crestor to '5mg'$  every other day, due to myalgias. She continued to have some myalgias.   Today:  She no longer takes Rosuvastatin or Crestor because of her myalgias. They have been replaced with Repatha.   Her LDL is now 71, her triglycerides are 75, and her HDL is 101.  Her blood pressure today is higher than it usually is, at home her systolic blood pressure is in the 110s to 120s. She attributes this to her coffee, being at the doctors office, and possibly taking Mucinex today.   She stays active with 45-minute spin classes.  She denies any palpitations, chest pain, shortness of breath, or peripheral edema. No lightheadedness, headaches, syncope, orthopnea, or PND.    Past Medical History:  Diagnosis Date   Complication of anesthesia    took much longer to awaken from anesthesia-45 yrs ago,last colonoscopy not a problem few yrs ago   Diverticulosis    Elevated cholesterol    Glaucoma    bilateral   Hypertension    Hypothyroidism    Osteoporosis 08/2018   T score -2.5    Past Surgical History:  Procedure Laterality Date   COLONOSCOPY WITH PROPOFOL N/A 03/13/2014   Procedure: COLONOSCOPY WITH PROPOFOL;  Surgeon: Garlan Fair, MD;  Location: [facility];  Service: Endoscopy;  Laterality:  N/A;   DILATION AND CURETTAGE OF UTERUS     MISSED AB   HAND SURGERY     KNEE SURGERY Left    left knee scope    Current Medications: Current Meds  Medication Sig   albuterol (VENTOLIN HFA) 108 (90 Base) MCG/ACT inhaler 2 puffs as needed   amLODipine (NORVASC) 10 MG tablet Take 10 mg by mouth at bedtime.   ANUCORT-HC 25 MG suppository INSERT 1 SUPPOSITORY RECTALLY TWICE DAILY AS NEEDED FOR HEMORRHOIDS   BIOTIN PO Take 1 tablet by mouth daily.   brimonidine (ALPHAGAN P) 0.1 % SOLN    Cetirizine HCl (ZYRTEC ALLERGY PO) Take by mouth daily.   Cholecalciferol (VITAMIN D) 125 MCG (5000 UT) CAPS 1 capsule   EPINEPHrine 0.3 mg/0.3 mL IJ SOAJ injection See admin instructions.   Evolocumab (REPATHA SURECLICK) 355 MG/ML SOAJ Inject 140 mg into the skin every 14 (fourteen) days.   glucosamine-chondroitin 500-400 MG tablet daily.   latanoprost (XALATAN) 0.005 % ophthalmic solution Place 1 drop into both eyes at bedtime.   levothyroxine (SYNTHROID) 75 MCG tablet Take 1 tablet by mouth daily. Pt takes 75 mcg every third day   levothyroxine (SYNTHROID, LEVOTHROID) 88 MCG tablet Take 88 mcg by mouth. Pt takes 88 mcg as directed   Multiple Vitamin (MULTIVITAMIN WITH MINERALS) TABS tablet Take 1 tablet by mouth daily.   TURMERIC CURCUMIN PO Take by mouth.     Allergies:   Bee venom, Sulfa antibiotics, Atorvastatin, Levothyroxine sodium,  Methimazole, Penicillins, and Rosuvastatin   Social History   Tobacco Use   Smoking status: Former    Types: Cigarettes    Quit date: 03/01/1970    Years since quitting: 51.8   Smokeless tobacco: Never  Vaping Use   Vaping Use: Never used  Substance Use Topics   Alcohol use: Yes    Alcohol/week: 0.0 standard drinks of alcohol    Comment: rarely 1-2 month   Drug use: No     Family History: The patient's family history includes Diabetes in her maternal grandmother; Heart disease in her brother, mother, sister, and sister; Hypertension in her father, mother,  and sister.  ROS:   Please see the history of present illness.     No pertinent symptoms discussed.   All other systems reviewed and are negative.  EKGs/Labs/Other Studies Reviewed:    The following studies were reviewed today:  CT Cardiac Score 12/06/2019: IMPRESSION: 1. Coronary calcium score is 132 and this is at percentile 63 for patients of the same age, gender and ethnicity. 2. 3 mm nodular density in the left lower lobe is nonspecific. No follow-up needed if patient is low-risk. Non-contrast chest CT can be considered in 12 months if patient is high-risk. This recommendation follows the consensus statement: Guidelines for Management of Incidental Pulmonary Nodules Detected on CT Images: From the Fleischner Society 2017; Radiology 2017; 284:228-243.  Echo 11/17/2018: 1. Left ventricular ejection fraction, by visual estimation, is 55 to 60%. The left ventricle has normal function. There is no left ventricular hypertrophy. 2. Left ventricular diastolic Doppler parameters are consistent with impaired relaxation pattern of LV diastolic filling. 3. Global right ventricle has normal systolic function.The right ventricular size is normal. No increase in right ventricular wall thickness. 4. Left atrial size was normal. 5. Right atrial size was normal. 6. The mitral valve is normal in structure. Trace mitral valve regurgitation. 7. The tricuspid valve is normal in structure. Tricuspid valve regurgitation is trivial. 8. The aortic valve is normal in structure. Aortic valve regurgitation is trivial by color flow Doppler. 9. The pulmonic valve was normal in structure. Pulmonic valve regurgitation is not visualized by color flow Doppler. 10. The atrial septum is grossly normal.  EKG:  12/13/21: Sr, nonspecific T-wave abnormality  06/19/2020: NSR, rate 63 bpm 12/19/2019: NSR rate 63  Recent Labs: 06/14/2021: ALT 19  Recent Lipid Panel    Component Value Date/Time   CHOL 186  06/14/2021 0822   TRIG 75 06/14/2021 0822   HDL 101 06/14/2021 0822   CHOLHDL 1.8 06/14/2021 0822   LDLCALC 71 06/14/2021 0822    Physical Exam:    VS:  BP (!) 146/68   Pulse 67   Ht '5\' 2"'$  (1.575 m)   Wt 158 lb (71.7 kg)   SpO2 99%   BMI 28.90 kg/m     Wt Readings from Last 5 Encounters:  12/13/21 158 lb (71.7 kg)  07/23/21 156 lb (70.8 kg)  12/10/20 156 lb 3.2 oz (70.9 kg)  07/16/20 154 lb (69.9 kg)  07/12/20 153 lb (69.4 kg)    Constitutional: No acute distress Eyes: sclera non-icteric, normal conjunctiva and lids ENMT: normal dentition, moist mucous membranes Cardiovascular: regular rhythm, normal rate, no murmurs. S1 and S2 normal.  No jugular venous distention.  Respiratory: clear to auscultation bilaterally GI : normal bowel sounds, soft and nontender. No distention.   MSK: extremities warm, well perfused. No edema.  NEURO: grossly nonfocal exam, moves all extremities. PSYCH: alert and oriented x  3, normal mood and affect.   ASSESSMENT:    1. Essential hypertension, benign   2. Coronary artery calcification   3. Hyperlipidemia, unspecified hyperlipidemia type    PLAN:    CAD with coronary artery calcifications  HLD   -continue repatha, at LDL goal.  - ASA 81 mg daily,reasonable to stop given overall low risk status. - continue moderate aerobic exercise as noted below for best CV health  HTN - continue on amlodipine 10 mg daily   CV risk reduction - she would like to continue diet and lifestyle modification for best CV health. Discussed as below:  Exercise recommendations: Goal of exercising for at least 30 minutes a day, at least 5 times per week.  Please exercise to a moderate exertion.  This means that while exercising it is difficult to speak in full sentences, however you are not so short of breath that you feel you must stop, and not so comfortable that you can carry on a full conversation.  Exertion level should be approximately a 5/10, if 10 is the  most exertion you can perform.  Diet recommendations: Recommend a heart healthy diet such as the Mediterranean diet.  This diet consists of plant based foods, healthy fats, lean meats, olive oil.  It suggests limiting the intake of simple carbohydrates such as white breads, pastries, and pastas.  It also limits the amount of red meat, wine, and dairy products such as cheese that one should consume on a daily basis.   Total time of encounter: 30 minutes total time of encounter, including 20 minutes spent in face-to-face patient care on the date of this encounter. This time includes coordination of care and counseling regarding above mentioned problem list. Remainder of non-face-to-face time involved reviewing chart documents/testing relevant to the patient encounter and documentation in the medical record. I have independently reviewed documentation from referring provider.   Cherlynn Kaiser, MD [place]  [facility]    Medication Adjustments/Labs and Tests Ordered: Current medicines are reviewed at length with the patient today.  Concerns regarding medicines are outlined above.   Orders Placed This Encounter  Procedures   EKG 12-Lead    No orders of the defined types were placed in this encounter.   Patient Instructions  Medication Instructions:  No Changes In Medications at this time.  *If you need a refill on your cardiac medications before your next appointment, please call your pharmacy*  Lab Work: None Ordered At This Time.  If you have labs (blood work) drawn today and your tests are completely normal, you will receive your results only by: [place] (if you have MyChart) OR A paper copy in the mail If you have any lab test that is abnormal or we need to change your treatment, we will call you to review the results.  Testing/Procedures: None Ordered At This Time.   Follow-Up: At [facility], you and your health needs are our priority.  As part of  our continuing mission to provide you with exceptional heart care, we have created designated Provider Care Teams.  These Care Teams include your primary Cardiologist (physician) and Advanced Practice Providers (APPs -  Physician Assistants and Nurse Practitioners) who all work together to provide you with the care you need, when you need it.  Your next appointment:   1 year(s)  The format for your next appointment:   In Person  Provider:   Elouise Munroe, MD  I,Yanira Mosetta Pigeon Buren,acting as a scribe for Elouise Munroe, MD.,have documented all relevant documentation on the behalf of Elouise Munroe, MD,as directed by  Elouise Munroe, MD while in the presence of Elouise Munroe, MD.   I, Elouise Munroe, MD, have reviewed all documentation for the visit on 12/13/2021. The documentation on today's date of service for the exam, diagnosis, procedures, and orders are all accurate and complete.

## 2021-12-17 ENCOUNTER — Encounter (HOSPITAL_COMMUNITY): Payer: Self-pay | Admitting: *Deleted

## 2021-12-17 ENCOUNTER — Ambulatory Visit (HOSPITAL_COMMUNITY)
Admission: EM | Admit: 2021-12-17 | Discharge: 2021-12-17 | Disposition: A | Discharge status: Home / Self Care | Payer: Medicare Other | Attending: Emergency Medicine | Admitting: Emergency Medicine

## 2021-12-17 DIAGNOSIS — J4520 Mild intermittent asthma, uncomplicated: Secondary | ICD-10-CM | POA: Diagnosis not present

## 2021-12-17 MED ORDER — PREDNISONE 10 MG PO TABS: ORAL_TABLET | ORAL | 0 refills | Status: DC

## 2021-12-17 MED ORDER — METHYLPREDNISOLONE SODIUM SUCC 125 MG IJ SOLR
80.0000 mg | Freq: Once | INTRAMUSCULAR | Status: AC
  Administered 2021-12-17: 80 mg via INTRAMUSCULAR

## 2021-12-17 MED ORDER — ALBUTEROL SULFATE (2.5 MG/3ML) 0.083% IN NEBU
2.5000 mg | INHALATION_SOLUTION | Freq: Once | RESPIRATORY_TRACT | Status: AC
  Administered 2021-12-17: 2.5 mg via RESPIRATORY_TRACT

## 2021-12-17 MED ORDER — ALBUTEROL SULFATE (2.5 MG/3ML) 0.083% IN NEBU
INHALATION_SOLUTION | RESPIRATORY_TRACT | Status: AC
  Filled 2021-12-17: qty 3

## 2021-12-17 MED ORDER — METHYLPREDNISOLONE SODIUM SUCC 125 MG IJ SOLR
INTRAMUSCULAR | Status: AC
  Filled 2021-12-17: qty 2

## 2021-12-17 NOTE — ED Triage Notes (Signed)
Pt states for 5-6 days she has had cough and congestion and she has been using her albuterol MDI more than normal. She is taking decongestants and Robitussin without relief. She called pulmonary and pcp but advised to come to UC.  She has taken a couple covid tests at home and all Neg.

## 2021-12-17 NOTE — Discharge Instructions (Signed)
Prednisone taper has been sent to the pharmacy.  Please start using this medication tomorrow since we gave you the steroid injection today in office.   Continue to use your albuterol inhaler every 6 hours today and tomorrow.

## 2021-12-17 NOTE — ED Provider Notes (Signed)
[place]    CSN: [id number] Arrival date & time: 12/17/21  1319      History   Chief Complaint Chief Complaint  Patient presents with   Nasal Congestion   Cough    HPI April Bowers is a 76 y.o. female.  Patient complaining of adductive cough and nasal congestion for the past 5 to 6 days.  Patient reports episodes of shortness of breath with coughing.  Patient reports rhinorrhea.  Patient states she has used her albuterol inhaler with no relief of symptoms.  Patient reports difficulty sleeping due to coughing at night . Patient denies any fever or chills.  Patient denies any known exposure to any viral illness.  Patient states " this feels like another asthma flare, this happens around this time of the year".  Patient states that she attempted to see her allergist and PCP, but was sent to urgent care.  Patient reports using a sinus rinse with some relief of symptoms.    Cough Associated symptoms: rhinorrhea and shortness of breath   Associated symptoms: no chest pain, no chills, no ear pain, no fever, no sore throat and no wheezing     Past Medical History:  Diagnosis Date   Complication of anesthesia    took much longer to awaken from anesthesia-45 yrs ago,last colonoscopy not a problem few yrs ago   Diverticulosis    Elevated cholesterol    Glaucoma    bilateral   Hypertension    Hypothyroidism    Osteoporosis 08/2018   T score -2.5    Patient Active Problem List   Diagnosis Date Noted   Cardiac arrhythmia 01/22/2021   Chronic pain 01/22/2021   Degeneration of lumbar intervertebral disc 01/22/2021   Diverticular disease of colon 01/22/2021   Family history of coronary artery disease 01/22/2021   Localized, primary osteoarthritis of hand 01/22/2021   Osteoarthritis of hip 01/22/2021   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   Ventricular premature depolarization 01/22/2021   Allergic rhinitis 09/13/2020   Bee allergy status 09/13/2020   Chronic  allergic conjunctivitis 09/13/2020   Mild persistent asthma, uncomplicated 42/59/5638   Hypercholesteremia 07/12/2020   Age-related osteoporosis without current pathological fracture 07/12/2019   Fibroids, intramural 07/12/2019   Stiffness of finger joint of right hand 05/03/2019   Encounter for orthopedic follow-up care 02/16/2019   Pain in right hand 02/01/2019   Dupuytren's disease of palm of right hand 01/26/2019   Arthritis of carpometacarpal [facility]) joint of left thumb 09/17/2017   Pain in left knee 02/24/2017   Essential hypertension, benign 04/15/2013   Ovarian cyst    Osteopenia    Asthma    Hypothyroidism     Past Surgical History:  Procedure Laterality Date   COLONOSCOPY WITH PROPOFOL N/A 03/13/2014   Procedure: COLONOSCOPY WITH PROPOFOL;  Surgeon: Garlan Fair, MD;  Location: [facility];  Service: Endoscopy;  Laterality: N/A;   DILATION AND CURETTAGE OF UTERUS     MISSED AB   HAND SURGERY     KNEE SURGERY Left    left knee scope    OB History     Gravida  3   Para  1   Term  1   Preterm      AB  2   Living  0      SAB      IAB      Ectopic      Multiple      Live Births  Home Medications    Prior to Admission medications   Medication Sig Start Date End Date Taking? Authorizing Provider  albuterol (VENTOLIN HFA) 108 (90 Base) MCG/ACT inhaler 2 puffs as needed   Yes [provider]  amLODipine (NORVASC) 10 MG tablet Take 10 mg by mouth at bedtime.   Yes [provider]  ANUCORT-HC 25 MG suppository INSERT 1 SUPPOSITORY RECTALLY TWICE DAILY AS NEEDED FOR HEMORRHOIDS 02/12/21  Yes Juleen China, Tiffany A, NP  BIOTIN PO Take 1 tablet by mouth daily.   Yes [provider]  brimonidine (ALPHAGAN P) 0.1 % SOLN  09/19/20  Yes [provider]  Cetirizine HCl (ZYRTEC ALLERGY PO) Take by mouth daily.   Yes [provider]  Cholecalciferol (VITAMIN D) 125 MCG (5000 UT) CAPS 1 capsule   Yes  [provider]  EPINEPHrine 0.3 mg/0.3 mL IJ SOAJ injection See admin instructions.   Yes [provider]  Evolocumab (REPATHA SURECLICK) 132 MG/ML SOAJ Inject 140 mg into the skin every 14 (fourteen) days. 07/11/21  Yes Elouise Munroe, MD  glucosamine-chondroitin 500-400 MG tablet daily.   Yes [provider]  latanoprost (XALATAN) 0.005 % ophthalmic solution Place 1 drop into both eyes at bedtime.   Yes [provider]  levothyroxine (SYNTHROID) 75 MCG tablet Take 1 tablet by mouth daily. Pt takes 75 mcg every third day   Yes [provider]  levothyroxine (SYNTHROID, LEVOTHROID) 88 MCG tablet Take 88 mcg by mouth. Pt takes 88 mcg as directed   Yes [provider]  Multiple Vitamin (MULTIVITAMIN WITH MINERALS) TABS tablet Take 1 tablet by mouth daily.   Yes [provider]  predniSONE (DELTASONE) 10 MG tablet Take 6 tablets on the first day, 5 tablets on the second day, 4 tablets on the third day, 3 tablets on the fourth day, 2 tablets on the fifth day, and 1 tablet on the last day. 12/17/21  Yes Flossie Dibble, NP  TURMERIC CURCUMIN PO Take by mouth.   Yes [provider]    Family History Family History  Problem Relation Age of Onset   Hypertension Mother    Heart disease Mother        CVA-DECEASED   Hypertension Father    Heart disease Sister    Hypertension Sister    Heart disease Sister    Heart disease Brother    Diabetes Maternal Grandmother     Social History Social History   Tobacco Use   Smoking status: Former    Types: Cigarettes    Quit date: 03/01/1970    Years since quitting: 51.8   Smokeless tobacco: Never  Vaping Use   Vaping Use: Never used  Substance Use Topics   Alcohol use: Yes    Alcohol/week: 0.0 standard drinks of alcohol    Comment: rarely 1-2 month   Drug use: No     Allergies   Bee venom, Sulfa antibiotics, Atorvastatin, Levothyroxine sodium, Methimazole,  Penicillins, and Rosuvastatin   Review of Systems Review of Systems  Constitutional:  Positive for fatigue. Negative for activity change, appetite change, chills and fever.  HENT:  Positive for congestion and rhinorrhea. Negative for ear discharge, ear pain, postnasal drip, sinus pressure, sinus pain, sore throat and trouble swallowing.   Eyes: Negative.   Respiratory:  Positive for cough and shortness of breath. Negative for chest tightness and wheezing.   Cardiovascular:  Negative for chest pain and palpitations.  Gastrointestinal:  Negative for abdominal pain, diarrhea, nausea  and vomiting.     Physical Exam Triage Vital Signs ED Triage Vitals  Enc Vitals Group     BP 12/17/21 1352 (!) 154/66     Pulse Rate 12/17/21 1352 74     Resp 12/17/21 1352 18     Temp 12/17/21 1352 98.1 F (36.7 C)     Temp Source 12/17/21 1352 Oral     SpO2 12/17/21 1352 99 %     Weight --      Height --      Head Circumference --      Peak Flow --      Pain Score 12/17/21 1351 0     Pain Loc --      Pain Edu? --      Excl. in [place]? --    No data found.  Updated Vital Signs BP (!) 154/66 (BP Location: Left Arm)   Pulse 74   Temp 98.1 F (36.7 C) (Oral)   Resp 18   SpO2 99%     Physical Exam Vitals and nursing note reviewed.  Constitutional:      Appearance: Normal appearance.  HENT:     Nose: Rhinorrhea present. No congestion. Rhinorrhea is clear.     Right Turbinates: Pale. Not enlarged or swollen.     Left Turbinates: Pale. Not enlarged or swollen.     Right Sinus: No maxillary sinus tenderness or frontal sinus tenderness.     Left Sinus: No maxillary sinus tenderness or frontal sinus tenderness.  Cardiovascular:     Rate and Rhythm: Normal rate and regular rhythm.     Heart sounds: Normal heart sounds, S1 normal and S2 normal.  Pulmonary:     Effort: Pulmonary effort is normal.     Breath sounds: Examination of the right-middle field reveals decreased breath sounds. Examination  of the left-middle field reveals decreased breath sounds. Examination of the right-lower field reveals wheezing. Examination of the left-lower field reveals decreased breath sounds. Decreased breath sounds and wheezing present.  Neurological:     Mental Status: She is alert.      UC Treatments / Results  Labs (all labs ordered are listed, but only abnormal results are displayed) Labs Reviewed - No data to display  EKG   Radiology No results found.  Procedures Procedures (including critical care time)  Medications Ordered in UC Medications  albuterol (PROVENTIL) (2.5 MG/3ML) 0.083% nebulizer solution 2.5 mg (2.5 mg Nebulization Given 12/17/21 1433)  methylPREDNISolone sodium succinate (SOLU-MEDROL) 125 mg/2 mL injection 80 mg (80 mg Intramuscular Given 12/17/21 1431)    Initial Impression / Assessment and Plan / UC Course  I have reviewed the triage vital signs and the nursing notes.  Pertinent labs & imaging results that were available during my care of the patient were reviewed by me and considered in my medical decision making (see chart for details).     Patient was treated for an asthma exacerbation.  Solu-Medrol and albuterol nebulizer given in office.  Patient experienced relief after medication administration. Patient was prescribed a prednisone taper that she was directed to start tomorrow.  Patient was made aware of medication regiment and possible side effects.  Patient was directed to use her albuterol inhaler every 6 hours for the next 2 days.  Patient verbalized understanding of instructions.  Final Clinical Impressions(s) / UC Diagnoses   Final diagnoses:  Mild intermittent asthma, unspecified whether complicated     Discharge Instructions      Prednisone taper has been sent  to the pharmacy.  Please start using this medication tomorrow since we gave you the steroid injection today in office.   Continue to use your albuterol inhaler every 6 hours today and  tomorrow.      ED Prescriptions     Medication Sig Dispense Auth. Provider   predniSONE (DELTASONE) 10 MG tablet Take 6 tablets on the first day, 5 tablets on the second day, 4 tablets on the third day, 3 tablets on the fourth day, 2 tablets on the fifth day, and 1 tablet on the last day. 21 tablet Flossie Dibble, NP      PDMP not reviewed this encounter.   Flossie Dibble, NP 12/17/21 1506

## 2021-12-30 ENCOUNTER — Other Ambulatory Visit: Payer: Self-pay | Admitting: Physician Assistant

## 2021-12-30 DIAGNOSIS — N83202 Unspecified ovarian cyst, left side: Secondary | ICD-10-CM

## 2022-02-27 ENCOUNTER — Other Ambulatory Visit (HOSPITAL_COMMUNITY): Payer: Self-pay

## 2022-03-11 NOTE — Therapy (Unsigned)
OUTPATIENT PHYSICAL THERAPY SHOULDER EVALUATION   Patient Name: April Bowers MRN: [id number] DOB:09/24/1945, 77 y.o., female Today's Date: 03/13/2022  END OF SESSION:  PT End of Session - 03/13/22 1451     Visit Number 1    Number of Visits 6    Date for PT Re-Evaluation 05/08/22    Authorization Type [employer]ICARE    Progress Note Due on Visit 10    PT Start Time 1400    PT Stop Time 1440    PT Time Calculation (min) 40 min    Activity Tolerance Patient tolerated treatment well    Behavior During Therapy [facility] for tasks assessed/performed             Past Medical History:  Diagnosis Date   Complication of anesthesia    took much longer to awaken from anesthesia-45 yrs ago,last colonoscopy not a problem few yrs ago   Diverticulosis    Elevated cholesterol    Glaucoma    bilateral   Hypertension    Hypothyroidism    Osteoporosis 08/2018   T score -2.5   Past Surgical History:  Procedure Laterality Date   COLONOSCOPY WITH PROPOFOL N/A 03/13/2014   Procedure: COLONOSCOPY WITH PROPOFOL;  Surgeon: Garlan Fair, MD;  Location: [facility];  Service: Endoscopy;  Laterality: N/A;   DILATION AND CURETTAGE OF UTERUS     MISSED AB   HAND SURGERY     KNEE SURGERY Left    left knee scope   Patient Active Problem List   Diagnosis Date Noted   Cardiac arrhythmia 01/22/2021   Chronic pain 01/22/2021   Degeneration of lumbar intervertebral disc 01/22/2021   Diverticular disease of colon 01/22/2021   Family history of coronary artery disease 01/22/2021   Localized, primary osteoarthritis of hand 01/22/2021   Osteoarthritis of hip 01/22/2021   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   Ventricular premature depolarization 01/22/2021   Allergic rhinitis 09/13/2020   Bee allergy status 09/13/2020   Chronic allergic conjunctivitis 09/13/2020   Mild persistent asthma, uncomplicated 32/95/1884   Hypercholesteremia 07/12/2020   Age-related osteoporosis without  current pathological fracture 07/12/2019   Fibroids, intramural 07/12/2019   Stiffness of finger joint of right hand 05/03/2019   Encounter for orthopedic follow-up care 02/16/2019   Pain in right hand 02/01/2019   Dupuytren's disease of palm of right hand 01/26/2019   Arthritis of carpometacarpal [facility]) joint of left thumb 09/17/2017   Pain in left knee 02/24/2017   Essential hypertension, benign 04/15/2013   Ovarian cyst    Osteopenia    Asthma    Hypothyroidism      REFERRING PROVIDER: Inez Catalina, MD  REFERRING DIAG: Acute pain of left shoulder [M25.512]   THERAPY DIAG:  Abnormal posture - Plan: PT plan of care cert/re-cert  Muscle weakness (generalized) - Plan: PT plan of care cert/re-cert  Left shoulder pain, unspecified chronicity - Plan: PT plan of care cert/re-cert  Rationale for Evaluation and Treatment: Rehabilitation  ONSET DATE: 1st December 2023  SUBJECTIVE:  SUBJECTIVE STATEMENT: 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 She recalls feeling a pop in her R shoulder. Pt states that since the initial injury she had had an MRI which showed a small tear in her RTC.   PERTINENT HISTORY: HTN, Hyopthyrdoism, Osteoperosis.   PAIN:  Are you having pain? Yes: NPRS scale: 3 /10 Pain location: R shoulder Pain description: Sharp pain, achiness Aggravating factors: Moving in the wrong way  Relieving factors: Rest,   PRECAUTIONS: None  WEIGHT BEARING RESTRICTIONS: No  FALLS:  Has patient fallen in last 6 months? No  LIVING ENVIRONMENT: Lives with: lives with their spouse Lives in: House/apartment Stairs: Yes: Internal: 1 steps; none and External: 3 steps; bilateral but cannot reach both Has following equipment at home:  None  OCCUPATION: Retired   PLOF: Independent  PATIENT GOALS:Pt would like to get back to yoga.   NEXT MD VISIT:   OBJECTIVE:   DIAGNOSTIC FINDINGS:  Supraspinatus tear   PATIENT SURVEYS:  FOTO 66.13%, 68% predicted.   COGNITION: Overall cognitive status: Within functional limits for tasks assessed     SENSATION: WFL  POSTURE: Nothing significant   UPPER EXTREMITY ROM:   Active ROM Right eval Left eval  Shoulder flexion [facility] WFL Slight P!  Shoulder abduction [facility] WFL Slight P!  Shoulder internal rotation [facility] WFL Slight P!  Shoulder external rotation [facility] WFL Slight P!  (Blank rows = not tested)  UPPER EXTREMITY MMT:  MMT Right eval Left eval  Shoulder flexion 5 4  Shoulder abduction 5 4+  Shoulder internal rotation 5 4+  Shoulder external rotation 5 4+  Middle trapezius 5 4+  Lower trapezius 5 4  (Blank rows = not tested)  JOINT MOBILITY TESTING:  Normal   PALPATION:  None today.    TODAY'S TREATMENT:                                                                                                                                         DATE: Creating, reviewing, and completing below HEP   PATIENT EDUCATION: Education details: Educated pt on anatomy and physiology of current symptoms, FOTO, diagnosis, prognosis, HEP,  and POC. Person educated: Patient Education method: Customer service manager Education comprehension: verbalized understanding and returned demonstration  HOME EXERCISE PROGRAM: Access Code: [email or web address] URL: [email or web address] Date: 03/13/2022 Prepared by: Rudi Heap  Exercises - Shoulder External Rotation with Anchored Resistance  - 1 x daily - 7 x weekly - 2 sets - 10 reps - Shoulder Internal Rotation with Resistance  - 1 x daily - 7 x weekly - 2 sets - 10 reps - Standing Shoulder Row with Anchored Resistance  - 1 x daily - 7 x weekly - 2 sets - 10 reps - Single Arm Shoulder Extension with Resistance  - 1 x  daily - 7 x weekly -  2 sets - 10 reps - Standing Shoulder Flexion with Resistance  - 1 x daily - 7 x weekly - 2 sets - 10 reps  ASSESSMENT:  CLINICAL IMPRESSION: Patient referred to PT for acute L shoulder pain. She demonstrates full ROM with apprehension due to past pain experiences. Decreased strength in L UE. Pt Patient will benefit from skilled PT to address below impairments, limitations and improve overall function.  OBJECTIVE IMPAIRMENTS: decreased activity tolerance, decreased shoulder mobility, decreased ROM, decreased strength, impaired flexibility, impaired UE use, postural dysfunction, and pain.  ACTIVITY LIMITATIONS: reaching, lifting, carry,  cleaning, driving, and or occupation  PERSONAL FACTORS:  also affecting patient's functional outcome.  REHAB POTENTIAL: Good  CLINICAL DECISION MAKING: Stable/uncomplicated  EVALUATION COMPLEXITY: Low    GOALS: Short term PT Goals Target date: 03/27/2022 Pt will be I and compliant with HEP. Baseline:  Goal status: New Pt will decrease pain by 25% overall Baseline: Goal status: New  Long term PT goals Target date: 04/24/2022 Pt will improve Rt shoulder strength to at least 5/5 MMT to improve functional strength Baseline: Goal status: New Pt will improve FOTO to at least 68% functional to show improved function Baseline: Goal status: New Pt will reduce pain to overall less than 3/10 with usual activity and work activity. Baseline: Goal status: New       4. Pt will return to recreational activities without pain.         Baseline:                    Goal status: New PLAN: PT FREQUENCY: 1x  PT DURATION: 6 weeks  PLANNED INTERVENTIONS (unless contraindicated): aquatic PT, Canalith repositioning, cryotherapy, Electrical stimulation, Iontophoresis with 4 mg/ml dexamethasome, Moist heat, traction, Ultrasound, gait training, Therapeutic exercise, balance training, neuromuscular re-education, patient/family education, prosthetic  training, manual techniques, passive ROM, dry needling, taping, vasopnuematic device, vestibular, spinal manipulations, joint manipulations  PLAN FOR NEXT SESSION: review/ update HEP, strengthen parascapular muscles.    Lynden Ang, PT 03/13/2022, 2:52 PM

## 2022-03-12 ENCOUNTER — Ambulatory Visit
Admission: RE | Admit: 2022-03-12 | Discharge: 2022-03-12 | Disposition: A | Discharge status: Home / Self Care | Payer: Medicare Other | Source: Ambulatory Visit | Attending: Physician Assistant | Admitting: Physician Assistant

## 2022-03-12 DIAGNOSIS — N83202 Unspecified ovarian cyst, left side: Secondary | ICD-10-CM

## 2022-03-13 ENCOUNTER — Other Ambulatory Visit: Payer: Self-pay

## 2022-03-13 ENCOUNTER — Encounter: Payer: Self-pay | Admitting: Physical Therapy

## 2022-03-13 ENCOUNTER — Ambulatory Visit: Payer: Medicare Other | Attending: Sports Medicine | Admitting: Physical Therapy

## 2022-03-13 DIAGNOSIS — M25512 Pain in left shoulder: Secondary | ICD-10-CM | POA: Diagnosis present

## 2022-03-13 DIAGNOSIS — M6281 Muscle weakness (generalized): Secondary | ICD-10-CM | POA: Diagnosis present

## 2022-03-13 DIAGNOSIS — R293 Abnormal posture: Secondary | ICD-10-CM

## 2022-03-21 NOTE — Therapy (Unsigned)
OUTPATIENT PHYSICAL THERAPY SHOULDER TREATMENT NOTE   Patient Name: April Bowers MRN: [id number] DOB:07-06-1945, 77 y.o., female Today's Date: 03/21/2022  END OF SESSION:    Past Medical History:  Diagnosis Date   Complication of anesthesia    took much longer to awaken from anesthesia-45 yrs ago,last colonoscopy not a problem few yrs ago   Diverticulosis    Elevated cholesterol    Glaucoma    bilateral   Hypertension    Hypothyroidism    Osteoporosis 08/2018   T score -2.5   Past Surgical History:  Procedure Laterality Date   COLONOSCOPY WITH PROPOFOL N/A 03/13/2014   Procedure: COLONOSCOPY WITH PROPOFOL;  Surgeon: Garlan Fair, MD;  Location: [facility];  Service: Endoscopy;  Laterality: N/A;   DILATION AND CURETTAGE OF UTERUS     MISSED AB   HAND SURGERY     KNEE SURGERY Left    left knee scope   Patient Active Problem List   Diagnosis Date Noted   Cardiac arrhythmia 01/22/2021   Chronic pain 01/22/2021   Degeneration of lumbar intervertebral disc 01/22/2021   Diverticular disease of colon 01/22/2021   Family history of coronary artery disease 01/22/2021   Localized, primary osteoarthritis of hand 01/22/2021   Osteoarthritis of hip 01/22/2021   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   Ventricular premature depolarization 01/22/2021   Allergic rhinitis 09/13/2020   Bee allergy status 09/13/2020   Chronic allergic conjunctivitis 09/13/2020   Mild persistent asthma, uncomplicated 0000000   Hypercholesteremia 07/12/2020   Age-related osteoporosis without current pathological fracture 07/12/2019   Fibroids, intramural 07/12/2019   Stiffness of finger joint of right hand 05/03/2019   Encounter for orthopedic follow-up care 02/16/2019   Pain in right hand 02/01/2019   Dupuytren's disease of palm of right hand 01/26/2019   Arthritis of carpometacarpal [facility]) joint of left thumb 09/17/2017   Pain in left knee 02/24/2017   Essential hypertension, benign  04/15/2013   Ovarian cyst    Osteopenia    Asthma    Hypothyroidism      REFERRING PROVIDER: Inez Catalina, MD  REFERRING DIAG: Acute pain of left shoulder [M25.512]   THERAPY DIAG:  No diagnosis found.  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 She recalls feeling a pop in her R shoulder.  Pt states that since the initial injury she had had an MRI which showed a small tear in her RTC.   PERTINENT HISTORY: HTN, Hyopthyrdoism, Osteoperosis.   PAIN:  Are you having pain? Yes: NPRS scale: 3 /10 Pain location: R shoulder Pain description: Sharp pain, achiness Aggravating factors: Moving in the wrong way  Relieving factors: Rest,   PRECAUTIONS: None  WEIGHT BEARING RESTRICTIONS: No  FALLS:  Has patient fallen in last 6 months? No  LIVING ENVIRONMENT: Lives with: lives with their spouse Lives in: House/apartment Stairs: Yes: Internal: 1 steps; none and External: 3 steps; bilateral but cannot reach both Has following equipment at home: None  OCCUPATION: Retired   PLOF: Independent  PATIENT GOALS:Pt would like to get back to yoga.   NEXT MD VISIT:   OBJECTIVE:   DIAGNOSTIC FINDINGS:  Supraspinatus tear   PATIENT SURVEYS:  FOTO 66.13%, 68% predicted.   COGNITION: Overall cognitive status: Within functional limits for tasks assessed     SENSATION: WFL  POSTURE: Nothing significant   UPPER EXTREMITY ROM:   Active ROM Right eval Left eval  Shoulder flexion [facility] WFL Slight P!  Shoulder abduction [facility] WFL Slight P!  Shoulder internal rotation [facility] WFL Slight P!  Shoulder external rotation [facility] WFL Slight P!   (Blank rows = not tested)  UPPER EXTREMITY MMT:  MMT Right eval Left eval  Shoulder flexion 5 4  Shoulder abduction 5 4+  Shoulder internal rotation 5 4+  Shoulder external rotation 5 4+  Middle trapezius 5 4+  Lower trapezius 5 4  (Blank rows = not tested)  JOINT MOBILITY TESTING:  Normal   PALPATION:  None today.    TODAY'S TREATMENT:                                                                                                                                         DATE: Creating, reviewing, and completing below HEP   PATIENT EDUCATION: Education details: Educated pt on anatomy and physiology of current symptoms, FOTO, diagnosis, prognosis, HEP,  and POC. Person educated: Patient Education method: Customer service manager Education comprehension: verbalized understanding and returned demonstration  HOME EXERCISE PROGRAM: Access Code: [email or web address] URL: [email or web address] Date: 03/13/2022 Prepared by: Rudi Heap  Exercises - Shoulder External Rotation with Anchored Resistance  - 1 x daily - 7 x weekly - 2 sets - 10 reps - Shoulder Internal Rotation with Resistance  - 1 x daily - 7 x weekly - 2 sets - 10 reps - Standing Shoulder Row with Anchored Resistance  - 1 x daily - 7 x weekly - 2 sets - 10 reps - Single Arm Shoulder Extension with Resistance  - 1 x daily - 7 x weekly - 2 sets - 10 reps - Standing Shoulder Flexion with Resistance  - 1 x daily - 7 x weekly - 2 sets - 10 reps  ASSESSMENT:  CLINICAL  IMPRESSION: Patient referred to PT for acute L shoulder pain. She demonstrates full ROM with apprehension due to past pain experiences. Decreased strength in L UE. Pt Patient will benefit from skilled PT to address below impairments, limitations and improve overall function.  OBJECTIVE IMPAIRMENTS: decreased activity tolerance, decreased shoulder mobility, decreased ROM, decreased strength, impaired flexibility, impaired UE use, postural  dysfunction, and pain.  ACTIVITY LIMITATIONS: reaching, lifting, carry,  cleaning, driving, and or occupation  PERSONAL FACTORS:  also affecting patient's functional outcome.  REHAB POTENTIAL: Good  CLINICAL DECISION MAKING: Stable/uncomplicated  EVALUATION COMPLEXITY: Low    GOALS: Short term PT Goals Target date: 04/04/2022 Pt will be I and compliant with HEP. Baseline:  Goal status: New Pt will decrease pain by 25% overall Baseline: Goal status: New  Long term PT goals Target date: 05/02/2022 Pt will improve Rt shoulder strength to at least 5/5 MMT to improve functional strength Baseline: Goal status: New Pt will improve FOTO to at least 68% functional to show improved function Baseline: Goal status: New Pt will reduce pain to overall less than 3/10 with usual activity and work activity. Baseline: Goal status: New       4. Pt will return to recreational activities without pain.         Baseline:                    Goal status: New PLAN: PT FREQUENCY: 1x  PT DURATION: 6 weeks  PLANNED INTERVENTIONS (unless contraindicated): aquatic PT, Canalith repositioning, cryotherapy, Electrical stimulation, Iontophoresis with 4 mg/ml dexamethasome, Moist heat, traction, Ultrasound, gait training, Therapeutic exercise, balance training, neuromuscular re-education, patient/family education, prosthetic training, manual techniques, passive ROM, dry needling, taping, vasopnuematic device, vestibular, spinal manipulations, joint manipulations  PLAN FOR NEXT SESSION: review/ update HEP, strengthen parascapular muscles.    Lynden Ang, PT 03/21/2022, 11:11 AM

## 2022-03-24 ENCOUNTER — Ambulatory Visit: Payer: Medicare Other | Admitting: Physical Therapy

## 2022-03-24 ENCOUNTER — Encounter: Payer: Self-pay | Admitting: Physical Therapy

## 2022-03-24 DIAGNOSIS — M6281 Muscle weakness (generalized): Secondary | ICD-10-CM

## 2022-03-24 DIAGNOSIS — R293 Abnormal posture: Secondary | ICD-10-CM

## 2022-03-24 DIAGNOSIS — M25512 Pain in left shoulder: Secondary | ICD-10-CM

## 2022-04-02 ENCOUNTER — Ambulatory Visit: Payer: Medicare Other

## 2022-04-02 DIAGNOSIS — R293 Abnormal posture: Secondary | ICD-10-CM | POA: Diagnosis not present

## 2022-04-02 DIAGNOSIS — M6281 Muscle weakness (generalized): Secondary | ICD-10-CM

## 2022-04-02 DIAGNOSIS — M25512 Pain in left shoulder: Secondary | ICD-10-CM

## 2022-04-02 NOTE — Therapy (Addendum)
OUTPATIENT PHYSICAL THERAPY SHOULDER TREATMENT NOTE PHYSICAL THERAPY DISCHARGE SUMMARY  Visits from Start of Care: 3  Current functional level related to goals / functional outcomes: See below   Remaining deficits: See below   Education / Equipment: See below   Patient agrees to discharge. Patient goals were met. Patient is being discharged due to meeting the stated rehab goals.    Patient Name: April Bowers MRN: [id number] DOB:Feb 13, 1945, 77 y.o., female Today's Date: 04/02/2022  END OF SESSION:  PT End of Session - 04/02/22 1107     Visit Number 3    Number of Visits 6    Date for PT Re-Evaluation 05/08/22    Authorization Type [employer] MEDICARE    Progress Note Due on Visit 10    PT Start Time 1100    PT Stop Time 1130    PT Time Calculation (min) 30 min    Activity Tolerance Patient tolerated treatment well    Behavior During Therapy [facility] for tasks assessed/performed              Past Medical History:  Diagnosis Date   Complication of anesthesia    took much longer to awaken from anesthesia-45 yrs ago,last colonoscopy not a problem few yrs ago   Diverticulosis    Elevated cholesterol    Glaucoma    bilateral   Hypertension    Hypothyroidism    Osteoporosis 08/2018   T score -2.5   Past Surgical History:  Procedure Laterality Date   COLONOSCOPY WITH PROPOFOL N/A 03/13/2014   Procedure: COLONOSCOPY WITH PROPOFOL;  Surgeon: Garlan Fair, MD;  Location: [facility];  Service: Endoscopy;  Laterality: N/A;   DILATION AND CURETTAGE OF UTERUS     MISSED AB   HAND SURGERY     KNEE SURGERY Left    left knee scope   Patient Active Problem List   Diagnosis Date Noted   Cardiac arrhythmia 01/22/2021   Chronic pain 01/22/2021   Degeneration of lumbar intervertebral disc 01/22/2021   Diverticular disease of colon 01/22/2021   Family history of coronary artery disease 01/22/2021   Localized, primary osteoarthritis of hand 01/22/2021    Osteoarthritis of hip 01/22/2021   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   Ventricular premature depolarization 01/22/2021   Allergic rhinitis 09/13/2020   Bee allergy status 09/13/2020   Chronic allergic conjunctivitis 09/13/2020   Mild persistent asthma, uncomplicated 0000000   Hypercholesteremia 07/12/2020   Age-related osteoporosis without current pathological fracture 07/12/2019   Fibroids, intramural 07/12/2019   Stiffness of finger joint of right hand 05/03/2019   Encounter for orthopedic follow-up care 02/16/2019   Pain in right hand 02/01/2019   Dupuytren's disease of palm of right hand 01/26/2019   Arthritis of carpometacarpal [facility]) joint of left thumb 09/17/2017   Pain in left knee 02/24/2017   Essential hypertension, benign 04/15/2013   Ovarian cyst    Osteopenia    Asthma    Hypothyroidism      REFERRING PROVIDER: Inez Catalina, MD  REFERRING DIAG: Acute pain of left shoulder [M25.512]   THERAPY DIAG:  Abnormal posture  Left shoulder pain, unspecified chronicity  Muscle weakness (generalized)  Rationale for Evaluation and Treatment: Rehabilitation  ONSET DATE: 1st December 2023  SUBJECTIVE:  SUBJECTIVE STATEMENT: Pt reports "I am doing great and I think I can keep doing this on my own"  She reports she has returned to yoga.    PERTINENT HISTORY: HTN, Hyopthyrdoism, Osteoperosis.   PAIN:  Are you having pain? Yes: NPRS scale: 3 /10 Pain location: R shoulder Pain description: Sharp pain, achiness Aggravating factors: Moving in the wrong way  Relieving factors: Rest,   PRECAUTIONS: None  WEIGHT BEARING RESTRICTIONS: No  FALLS:  Has patient fallen in last 6 months? No  LIVING ENVIRONMENT: Lives with: lives with their spouse Lives in: House/apartment Stairs: Yes:  Internal: 1 steps; none and External: 3 steps; bilateral but cannot reach both Has following equipment at home: None  OCCUPATION: Retired   PLOF: Independent  PATIENT GOALS:Pt would like to get back to yoga.   NEXT MD VISIT:   OBJECTIVE:   DIAGNOSTIC FINDINGS:  Supraspinatus tear   PATIENT SURVEYS:  FOTO 66.13%, 68% predicted.  04/02/22:  86%  COGNITION: Overall cognitive status: Within functional limits for tasks assessed     SENSATION: WFL  POSTURE: Nothing significant   UPPER EXTREMITY ROM:   Active ROM Right eval Left eval Left 04/02/22  Shoulder flexion [facility] WFL Slight P! WFL with min discomfort  Shoulder abduction [facility] WFL Slight P! WFL with min discomfort  Shoulder internal rotation [facility] WFL Slight P! WFL with min discomfort  Shoulder external rotation [facility] WFL Slight P! WFL with min discomfort  (Blank rows = not tested)  UPPER EXTREMITY MMT:  MMT Right eval Left eval Left 04/02/22  Shoulder flexion 5 4 5  $ Shoulder abduction 5 4+ 5-  Shoulder internal rotation 5 4+ 5  Shoulder external rotation 5 4+ 4+  Middle trapezius 5 4+ 4+  Lower trapezius 5 4 4+  (Blank rows = not tested)  JOINT MOBILITY TESTING:  Normal   PALPATION:  None today.    TODAY'S TREATMENT:   Date: 04/02/2022:  Re-assessed for DC Added prone/fwd bent scapular stabilization and provided handout DC plan provided  Date: 03/24/2022:  UBE 3 min fwd/ 3 min bkwd Rows with RTB 2x10  Shdlr ext with RTB 2x10        Shldr ER with RTB 2x10  Shldr IR with RTB 2x10    OH reaches with weighted blue ball 2x10  Lat pull down 2x 10 with 25#                                                                                                                             DATE: Creating, reviewing, and completing below HEP   PATIENT EDUCATION: Education details: Educated pt on anatomy and physiology of current symptoms, FOTO, diagnosis, prognosis, HEP,  and POC. Person educated:  Patient Education method: Customer service manager Education comprehension: verbalized understanding and returned demonstration  HOME EXERCISE PROGRAM: Access Code: [email or web address] URL: [email or web address] Date: 04/02/2022 Prepared by: Candyce Churn  Exercises - Shoulder External Rotation with Anchored Resistance  - 1  x daily - 7 x weekly - 2 sets - 10 reps - Shoulder Internal Rotation with Resistance  - 1 x daily - 7 x weekly - 2 sets - 10 reps - Standing Shoulder Row with Anchored Resistance  - 1 x daily - 7 x weekly - 2 sets - 10 reps - Single Arm Shoulder Extension with Resistance  - 1 x daily - 7 x weekly - 2 sets - 10 reps - Standing Shoulder Flexion with Resistance  - 1 x daily - 7 x weekly - 2 sets - 10 reps - Single Arm Bent Over Shoulder Extension with Dumbbell  - 1 x daily - 7 x weekly - 2 sets - 10 reps - Bent Over Single Arm Shoulder Row with Dumbbell  - 1 x daily - 7 x weekly - 2 sets - 10 reps - Single Arm Bent Over Shoulder Horizontal Abduction with Dumbbell - Palm Down  - 1 x daily - 7 x weekly - 2 sets - 10 reps - Sidelying Shoulder External Rotation  - 1 x daily - 7 x weekly - 2 sets - 10 reps - Single Arm Serratus Punches in Supine with Dumbbell  - 1 x daily - 7 x weekly - 2 sets - 10 reps  ASSESSMENT:  CLINICAL IMPRESSION: Patient is reporting minimal to no pain.  She is independent and compliant with her HEP.  She as resumed her usual workout activities.  She requests DC today and PT agrees.  She should continue to do well.     OBJECTIVE IMPAIRMENTS: decreased activity tolerance, decreased shoulder mobility, decreased ROM, decreased strength, impaired flexibility, impaired UE use, postural dysfunction, and pain.  ACTIVITY LIMITATIONS: reaching, lifting, carry,  cleaning, driving, and or occupation  PERSONAL FACTORS:  also affecting patient's functional outcome.  REHAB POTENTIAL: Good  CLINICAL DECISION MAKING: Stable/uncomplicated  EVALUATION  COMPLEXITY: Low    GOALS: Short term PT Goals Target date: 04/10/22 Pt will be I and compliant with HEP. Baseline:  Goal status: MET Pt will decrease pain by 25% overall Baseline: Goal status: MET  Long term PT goals Target date: 05/12/22 Pt will improve Rt shoulder strength to at least 5/5 MMT to improve functional strength Baseline: Goal status: MET Pt will improve FOTO to at least 68% functional to show improved function Baseline: Goal status: MET Pt will reduce pain to overall less than 3/10 with usual activity and work activity. Baseline: Goal status: MET       4. Pt will return to recreational activities without pain.         Baseline:                    Goal status: MET PLAN: PT FREQUENCY: 1x  PT DURATION: 6 weeks  PLANNED INTERVENTIONS (unless contraindicated): aquatic PT, Canalith repositioning, cryotherapy, Electrical stimulation, Iontophoresis with 4 mg/ml dexamethasome, Moist heat, traction, Ultrasound, gait training, Therapeutic exercise, balance training, neuromuscular re-education, patient/family education, prosthetic training, manual techniques, passive ROM, dry needling, taping, vasopnuematic device, vestibular, spinal manipulations, joint manipulations  PLAN FOR NEXT SESSION: we will DC at this time with all goals met   Anderson Malta B. Kolbe [place], PT 04/02/22 11:41 AM  [place] [facility] [address] [place], [place] [address] Phone # [phone] Fax [phone]

## 2022-04-15 ENCOUNTER — Encounter: Payer: Medicare Other | Admitting: Rehabilitative and Restorative Service Providers"

## 2022-04-21 ENCOUNTER — Encounter: Payer: Medicare Other | Admitting: Rehabilitative and Restorative Service Providers"

## 2022-04-28 ENCOUNTER — Encounter: Payer: Medicare Other | Admitting: Physical Therapy

## 2022-06-20 ENCOUNTER — Other Ambulatory Visit: Payer: Self-pay | Admitting: Internal Medicine

## 2022-06-26 ENCOUNTER — Telehealth: Payer: Self-pay | Admitting: Internal Medicine

## 2022-06-26 MED ORDER — REPATHA SURECLICK 140 MG/ML ~~LOC~~ SOAJ: 140.0000 mg | SUBCUTANEOUS | 3 refills | Status: DC

## 2022-06-26 NOTE — Telephone Encounter (Signed)
*  STAT* If patient is at the pharmacy, call can be transferred to refill team.   1. Which medications need to be refilled? (please list name of each medication and dose if known)   Evolocumab (REPATHA SURECLICK) 140 MG/ML SOAJ   2. Which pharmacy/location (including street and city if local pharmacy) is medication to be sent to?  [facility] [address] [place], [place] - [address]   3. Do they need a 30 day or 90 day supply?   90 day  Patient stated she is almost out of this medication and she will be going out of town.

## 2022-06-26 NOTE — Telephone Encounter (Signed)
RX sent to preferred pharmacy

## 2022-12-10 ENCOUNTER — Ambulatory Visit: Payer: Medicare Other | Attending: Internal Medicine | Admitting: Internal Medicine

## 2022-12-10 ENCOUNTER — Encounter: Payer: Self-pay | Admitting: Internal Medicine

## 2022-12-10 VITALS — BP 126/64 | HR 68 | Ht 62.0 in | Wt 157.0 lb

## 2022-12-10 DIAGNOSIS — I4940 Unspecified premature depolarization: Secondary | ICD-10-CM | POA: Diagnosis not present

## 2022-12-10 DIAGNOSIS — I10 Essential (primary) hypertension: Secondary | ICD-10-CM | POA: Diagnosis not present

## 2022-12-10 NOTE — Progress Notes (Unsigned)
Cardiology Office Note:  .   Date:  12/10/2022  ID:  April Bowers, DOB 1945-02-15, MRN [id number] PCP: Kirby Funk, MD (Inactive)  [facility] Providers Cardiologist:  Parke Poisson, MD    History of Present Illness: Marland Kitchen   April Bowers is a 77 y.o. female.  Discussed the use of AI scribe software for clinical note transcription with the patient, who gave verbal consent to proceed.  History of Present Illness   The patient, with a family history of heart disease, presents for a routine check-up. She reports feeling well and has been maintaining a healthy lifestyle with regular exercise and a balanced diet. The patient is currently on Repatha for cholesterol management, amlodipine for blood pressure control, and thyroid medication. She reports no adverse effects from these medications. The patient has a calcium score of 132, indicating some plaque in the arteries, but is asymptomatic with no reported chest discomfort or changes in exercise tolerance. The patient also reports no palpitations or fluid retention.        ROS: negative except per HPI above.  Studies Reviewed: Marland Kitchen   EKG Interpretation Date/Time:  Wednesday December 10 2022 07:59:36 EDT Ventricular Rate:  68 PR Interval:  184 QRS Duration:  84 QT Interval:  436 QTC Calculation: 463 R Axis:   26  Text Interpretation: Sinus rhythm with occasional Premature ventricular complexes Low voltage QRS Confirmed by Weston Brass ([id number]) on 12/10/2022 8:08:01 AM    Results   LABS LDL: 71 mg/dL (32/4401)  RADIOLOGY Calcium score: 132, 63rd percentile (2021)  DIAGNOSTIC EKG: Normal, one PVC (12/09/2022)     Risk Assessment/Calculations:             Physical Exam:   VS:  BP 126/64 (BP Location: Left Arm, Patient Position: Sitting, Cuff Size: Normal)   Pulse 68   Ht 5\' 2"  (1.575 m)   Wt 157 lb (71.2 kg)   BMI 28.72 kg/m    Wt Readings from Last 3 Encounters:  12/10/22 157 lb (71.2 kg)  12/13/21 158 lb (71.7  kg)  07/23/21 156 lb (70.8 kg)     Physical Exam   VITALS: BP- 126/64 CHEST: Lungs clear to auscultation. CARDIOVASCULAR: Normal heart rhythm. Carotid arteries auscultation revealed no bruits. 4/4 pulse in ankles. No ankle edema.     GEN: Well nourished, well developed in no acute distress NECK: No JVD; No carotid bruits CARDIAC: RRR, no murmurs, rubs, gallops RESPIRATORY:  Clear to auscultation without rales, wheezing or rhonchi  ABDOMEN: Soft, non-tender, non-distended EXTREMITIES:  No edema; No deformity   ASSESSMENT AND PLAN: .    1. Essential hypertension, benign   2. Cardiac arrhythmia due to premature depolarization, unspecified type     Assessment and Plan    Hyperlipidemia LDL was 71 last year, patient tolerating Repatha well with no side effects. Patient reports preference for Repatha over previous statin therapy due to side effects with statins. -Continue Repatha for cholesterol management. -Check lipid panel with primary care physician today.  Hypertension Blood pressure well controlled on Amlodipine 10mg  daily. -Continue Amlodipine 10mg  daily.  Hypothyroidism Patient reports thyroid function was normal as of June this year. Patient taking Levothyroxine for 3 days and on the 4th day. -Continue current Levothyroxine regimen.  Coronary Artery Disease Calcium score was 132, placing patient in the 63rd percentile. Patient has family history of heart disease. Discussed the ongoing debate regarding aspirin therapy for primary prevention. Patient currently not on aspirin and comfortable  with this. -Monitor for any symptoms of chest discomfort during exercise. -Consider re-initiation of aspirin if compelling reasons arise.  General Health Maintenance / Followup Plans -Continue healthy diet and regular exercise. -Use compression socks for long flights and stay well hydrated. -Return for follow-up in 1 year.                Total time of  encounter: 25 minutes total time of encounter, including 18 minutes spent in face-to-face patient care on the date of this encounter. This time includes coordination of care and counseling regarding above mentioned problem list. Remainder of non-face-to-face time involved reviewing chart documents/testing relevant to the patient encounter and documentation in the medical record. I have independently reviewed documentation from referring provider.   Weston Brass, MD, [facility] [place]  [facility]

## 2022-12-10 NOTE — Patient Instructions (Signed)
Medication Instructions:  Your physician recommends that you continue on your current medications as directed. Please refer to the Current Medication list given to you today.  *If you need a refill on your cardiac medications before your next appointment, please call your pharmacy*   Follow-Up: At [facility], you and your health needs are our priority.  As part of our continuing mission to provide you with exceptional heart care, we have created designated Provider Care Teams.  These Care Teams include your primary Cardiologist (physician) and Advanced Practice Providers (APPs -  Physician Assistants and Nurse Practitioners) who all work together to provide you with the care you need, when you need it.  We recommend signing up for the patient portal called "MyChart".  Sign up information is provided on this After Visit Summary.  MyChart is used to connect with patients for Virtual Visits (Telemedicine).  Patients are able to view lab/test results, encounter notes, upcoming appointments, etc.  Non-urgent messages can be sent to your provider as well.   To learn more about what you can do with MyChart, go to [email or web address].    Your next appointment:   12 month(s)  Provider:   Gayatri A Acharya, MD    

## 2022-12-11 ENCOUNTER — Encounter: Payer: Self-pay | Admitting: Physician Assistant

## 2022-12-11 ENCOUNTER — Encounter: Payer: Self-pay | Admitting: Internal Medicine

## 2023-03-03 ENCOUNTER — Other Ambulatory Visit: Payer: Self-pay | Admitting: Physician Assistant

## 2023-03-03 DIAGNOSIS — N838 Other noninflammatory disorders of ovary, fallopian tube and broad ligament: Secondary | ICD-10-CM

## 2023-03-03 DIAGNOSIS — N83202 Unspecified ovarian cyst, left side: Secondary | ICD-10-CM

## 2023-03-05 ENCOUNTER — Other Ambulatory Visit: Payer: Medicare Other

## 2023-03-12 ENCOUNTER — Ambulatory Visit
Admission: RE | Admit: 2023-03-12 | Discharge: 2023-03-12 | Disposition: A | Discharge status: Home / Self Care | Payer: Medicare Other | Source: Ambulatory Visit | Attending: Physician Assistant | Admitting: Physician Assistant

## 2023-03-12 DIAGNOSIS — N838 Other noninflammatory disorders of ovary, fallopian tube and broad ligament: Secondary | ICD-10-CM

## 2023-03-12 DIAGNOSIS — N83202 Unspecified ovarian cyst, left side: Secondary | ICD-10-CM

## 2023-05-27 ENCOUNTER — Other Ambulatory Visit: Payer: Self-pay | Admitting: Internal Medicine

## 2023-06-05 ENCOUNTER — Other Ambulatory Visit: Payer: Self-pay | Admitting: Pharmacist

## 2023-06-05 MED ORDER — REPATHA SURECLICK 140 MG/ML ~~LOC~~ SOAJ: 140.0000 mg | SUBCUTANEOUS | 3 refills | Status: DC

## 2023-06-08 MED ORDER — REPATHA SURECLICK 140 MG/ML ~~LOC~~ SOAJ: 140.0000 mg | SUBCUTANEOUS | 3 refills | Status: AC

## 2023-06-08 NOTE — Addendum Note (Signed)
 Addended by: Hatim Homann D on: 06/08/2023 12:16 PM   Modules accepted: Orders

## 2023-12-03 ENCOUNTER — Telehealth: Payer: Self-pay | Admitting: Pharmacist

## 2023-12-03 NOTE — Telephone Encounter (Signed)
 Repatha  replacement called into [facility]

## 2023-12-04 NOTE — Telephone Encounter (Signed)
 Pt is calling to f/u please advise

## 2023-12-04 NOTE — Telephone Encounter (Signed)
 Patient says she is returning another call regarding this matter. She says she accidentally declined the call.

## 2023-12-04 NOTE — Telephone Encounter (Signed)
 Called and left patient a very nonspecific message that her placement was called in and they should be contacting her to set up shipment.

## 2023-12-17 ENCOUNTER — Other Ambulatory Visit: Payer: Self-pay | Admitting: Internal Medicine

## 2023-12-17 DIAGNOSIS — N83202 Unspecified ovarian cyst, left side: Secondary | ICD-10-CM

## 2023-12-17 DIAGNOSIS — N838 Other noninflammatory disorders of ovary, fallopian tube and broad ligament: Secondary | ICD-10-CM

## 2023-12-22 ENCOUNTER — Inpatient Hospital Stay
Admission: RE | Admit: 2023-12-22 | Discharge: 2023-12-22 | Disposition: A | Source: Ambulatory Visit | Attending: Internal Medicine | Admitting: Internal Medicine

## 2023-12-22 DIAGNOSIS — N83202 Unspecified ovarian cyst, left side: Secondary | ICD-10-CM

## 2023-12-22 DIAGNOSIS — N838 Other noninflammatory disorders of ovary, fallopian tube and broad ligament: Secondary | ICD-10-CM

## 2024-01-20 ENCOUNTER — Ambulatory Visit: Payer: Self-pay | Admitting: Internal Medicine

## 2024-01-20 DIAGNOSIS — Z79899 Other long term (current) drug therapy: Secondary | ICD-10-CM

## 2024-01-20 DIAGNOSIS — I10 Essential (primary) hypertension: Secondary | ICD-10-CM

## 2024-01-20 DIAGNOSIS — E785 Hyperlipidemia, unspecified: Secondary | ICD-10-CM

## 2024-03-21 ENCOUNTER — Ambulatory Visit: Admitting: Pharmacist
